# Patient Record
Sex: Female | Born: 1990 | Race: Black or African American | Hispanic: No | Marital: Married | Smoking: Never smoker
Health system: Southern US, Community
[De-identification: ages and names within clinical notes are randomized; demographics above are authoritative.]

## PROBLEM LIST (undated history)

## (undated) DIAGNOSIS — N730 Acute parametritis and pelvic cellulitis: Secondary | ICD-10-CM

## (undated) DIAGNOSIS — M419 Scoliosis, unspecified: Secondary | ICD-10-CM

## (undated) HISTORY — PX: BACK SURGERY: SHX140

---

## 2015-09-07 ENCOUNTER — Emergency Department (HOSPITAL_COMMUNITY): Payer: Self-pay

## 2015-09-07 ENCOUNTER — Emergency Department (HOSPITAL_COMMUNITY)
Admission: EM | Admit: 2015-09-07 | Discharge: 2015-09-07 | Disposition: A | Discharge status: Home / Self Care | Payer: Self-pay | Attending: Emergency Medicine | Admitting: Emergency Medicine

## 2015-09-07 ENCOUNTER — Encounter (HOSPITAL_COMMUNITY): Payer: Self-pay

## 2015-09-07 DIAGNOSIS — Z202 Contact with and (suspected) exposure to infections with a predominantly sexual mode of transmission: Secondary | ICD-10-CM | POA: Insufficient documentation

## 2015-09-07 DIAGNOSIS — N730 Acute parametritis and pelvic cellulitis: Secondary | ICD-10-CM | POA: Insufficient documentation

## 2015-09-07 DIAGNOSIS — Z8739 Personal history of other diseases of the musculoskeletal system and connective tissue: Secondary | ICD-10-CM | POA: Insufficient documentation

## 2015-09-07 DIAGNOSIS — Z3202 Encounter for pregnancy test, result negative: Secondary | ICD-10-CM | POA: Insufficient documentation

## 2015-09-07 DIAGNOSIS — Z711 Person with feared health complaint in whom no diagnosis is made: Secondary | ICD-10-CM

## 2015-09-07 DIAGNOSIS — R110 Nausea: Secondary | ICD-10-CM | POA: Insufficient documentation

## 2015-09-07 HISTORY — DX: Scoliosis, unspecified: M41.9

## 2015-09-07 LAB — URINALYSIS, ROUTINE W REFLEX MICROSCOPIC
BILIRUBIN URINE: NEGATIVE
GLUCOSE, UA: NEGATIVE mg/dL
KETONES UR: NEGATIVE mg/dL
Nitrite: NEGATIVE
PH: 6 (ref 5.0–8.0)
Protein, ur: NEGATIVE mg/dL
Specific Gravity, Urine: 1.01 (ref 1.005–1.030)

## 2015-09-07 LAB — WET PREP, GENITAL
Sperm: NONE SEEN
Trich, Wet Prep: NONE SEEN
YEAST WET PREP: NONE SEEN

## 2015-09-07 LAB — LIPASE, BLOOD: Lipase: 20 U/L (ref 11–51)

## 2015-09-07 LAB — I-STAT BETA HCG BLOOD, ED (MC, WL, AP ONLY): I-stat hCG, quantitative: <5 m[IU]/mL (ref <5)

## 2015-09-07 LAB — COMPREHENSIVE METABOLIC PANEL
ALBUMIN: 3.7 g/dL (ref 3.5–5.0)
ALT: 13 U/L — AB (ref 14–54)
AST: 20 U/L (ref 15–41)
Alkaline Phosphatase: 52 U/L (ref 38–126)
Anion gap: 11 (ref 5–15)
BILIRUBIN TOTAL: 1.3 mg/dL — AB (ref 0.3–1.2)
BUN: 8 mg/dL (ref 6–20)
CHLORIDE: 105 mmol/L (ref 101–111)
CO2: 23 mmol/L (ref 22–32)
Calcium: 9.2 mg/dL (ref 8.9–10.3)
Creatinine, Ser: 0.91 mg/dL (ref 0.44–1.00)
GFR calc Af Amer: >60 mL/min (ref >60)
GFR calc non Af Amer: >60 mL/min (ref >60)
GLUCOSE: 111 mg/dL — AB (ref 65–99)
POTASSIUM: 3.6 mmol/L (ref 3.5–5.1)
Sodium: 139 mmol/L (ref 135–145)
Total Protein: 7.4 g/dL (ref 6.5–8.1)

## 2015-09-07 LAB — CBC
HEMATOCRIT: 34.3 % — AB (ref 36.0–46.0)
Hemoglobin: 12 g/dL (ref 12.0–15.0)
MCH: 29.6 pg (ref 26.0–34.0)
MCHC: 35 g/dL (ref 30.0–36.0)
MCV: 84.5 fL (ref 78.0–100.0)
Platelets: 205 10*3/uL (ref 150–400)
RBC: 4.06 MIL/uL (ref 3.87–5.11)
RDW: 12.4 % (ref 11.5–15.5)
WBC: 8.6 10*3/uL (ref 4.0–10.5)

## 2015-09-07 LAB — URINE MICROSCOPIC-ADD ON

## 2015-09-07 MED ORDER — DEXTROSE 5 % IV SOLN
1.0000 g | Freq: Once | INTRAVENOUS | Status: AC
  Administered 2015-09-07: 1 g via INTRAVENOUS
  Filled 2015-09-07: qty 10

## 2015-09-07 MED ORDER — ONDANSETRON 4 MG PO TBDP: 4.0000 mg | ORAL_TABLET | Freq: Three times a day (TID) | ORAL | Status: DC | PRN

## 2015-09-07 MED ORDER — DOXYCYCLINE HYCLATE 100 MG PO TABS
100.0000 mg | ORAL_TABLET | Freq: Once | ORAL | Status: AC
  Administered 2015-09-07: 100 mg via ORAL
  Filled 2015-09-07: qty 1

## 2015-09-07 MED ORDER — NAPROXEN 250 MG PO TABS: 250.0000 mg | ORAL_TABLET | Freq: Two times a day (BID) | ORAL | Status: DC

## 2015-09-07 MED ORDER — ONDANSETRON 4 MG PO TBDP
4.0000 mg | ORAL_TABLET | Freq: Once | ORAL | Status: AC
  Administered 2015-09-07: 4 mg via ORAL
  Filled 2015-09-07: qty 1

## 2015-09-07 MED ORDER — SODIUM CHLORIDE 0.9 % IV BOLUS (SEPSIS)
1000.0000 mL | Freq: Once | INTRAVENOUS | Status: AC
  Administered 2015-09-07: 1000 mL via INTRAVENOUS

## 2015-09-07 MED ORDER — AZITHROMYCIN 250 MG PO TABS
1000.0000 mg | ORAL_TABLET | Freq: Once | ORAL | Status: AC
  Administered 2015-09-07: 1000 mg via ORAL
  Filled 2015-09-07: qty 4

## 2015-09-07 MED ORDER — IBUPROFEN 400 MG PO TABS
800.0000 mg | ORAL_TABLET | Freq: Once | ORAL | Status: AC
Start: 2015-09-07 — End: 2015-09-07
  Administered 2015-09-07: 800 mg via ORAL
  Filled 2015-09-07: qty 2

## 2015-09-07 MED ORDER — DOXYCYCLINE HYCLATE 100 MG PO CAPS: 100.0000 mg | ORAL_CAPSULE | Freq: Two times a day (BID) | ORAL | Status: DC

## 2015-09-07 NOTE — ED Notes (Signed)
Pt reports lower abdominal pain, onset last night associated with fatigue, nausea and chills that started today. She denies vomiting. She states she thinks she has  UTI due to dysuria and urinary frequency but has not been to doctor for that.

## 2015-09-07 NOTE — ED Notes (Signed)
PT REPORTS SHE TOOK 650mg  OF TYLENOL ALREADY TODAY AT 1230.

## 2015-09-07 NOTE — ED Provider Notes (Signed)
CSN: [id number]     Arrival date & time 09/07/15  1543 History  By signing my name below, I, Iona Beard, attest that this documentation has been prepared under the direction and in the presence of Everlene Farrier, PA-C.   Electronically Signed: Iona Beard, ED Scribe 09/07/2015 at 11:25 PM.   Chief Complaint  Patient presents with  . Abdominal Pain  . Fatigue    The history is provided by the patient. No language interpreter was used.   HPI Comments: Erica Hahn is a 25 y.o. female who presents to the Emergency Department complaining of gradual onset, burning, dysuria, ongoing for three days. Pt reports associated lower abdominal pain beginning last night, bilateral side pain worse on right side beginning last night, mild nausea, urinary frequency, fever, chills, and generalized body aches. No other associated symptoms noted. No worsening or alleviating factors noted. Pt has Mirena.  Pt is currently sexually active but does not use protection. Last normal bowel movement was yesterday. Pt denies vomiting, diarrhea, vaginal bleeding, vaginal discharge, cough, shortness of breath, urinary urgency, or any other pertinent symptoms.  Past Medical History  Diagnosis Date  . Scoliosis    Past Surgical History  Procedure Laterality Date  . Back surgery     No family history on file. Social History  Substance Use Topics  . Smoking status: Never Smoker   . Smokeless tobacco: None  . Alcohol Use: Yes   OB History    No data available     Review of Systems  Constitutional: Positive for fever and chills.  HENT: Negative for mouth sores.   Eyes: Negative for visual disturbance.  Respiratory: Negative for cough and shortness of breath.   Gastrointestinal: Positive for nausea and abdominal pain. Negative for vomiting, diarrhea and blood in stool.  Genitourinary: Positive for dysuria, frequency and flank pain. Negative for urgency, hematuria, vaginal bleeding, vaginal discharge,  difficulty urinating and genital sores.       Flank pain worse on right side.   Musculoskeletal: Positive for myalgias.  Skin: Negative for rash.  Neurological: Negative for light-headedness.     Allergies  Review of patient's allergies indicates not on file.  Home Medications   Prior to Admission medications   Medication Sig Start Date End Date Taking? Authorizing Provider  doxycycline (VIBRAMYCIN) 100 MG capsule Take 1 capsule (100 mg total) by mouth 2 (two) times daily. 09/07/15   Everlene Farrier, PA-C  naproxen (NAPROSYN) 250 MG tablet Take 1 tablet (250 mg total) by mouth 2 (two) times daily with a meal. 09/07/15   Everlene Farrier, PA-C  ondansetron (ZOFRAN ODT) 4 MG disintegrating tablet Take 1 tablet (4 mg total) by mouth every 8 (eight) hours as needed for nausea or vomiting. 09/07/15   Everlene Farrier, PA-C   BP 116/81 mmHg  Pulse 122  Temp(Src) 101.9 F (38.8 C) (Oral)  Resp 16  Ht 5\' 4"  (1.626 m)  Wt 138 lb 8 oz (62.823 kg)  BMI 23.76 kg/m2  SpO2 100%  LMP  (LMP Unknown) Physical Exam  Constitutional: She appears well-developed and well-nourished. No distress.  Nontoxic appearing.  HENT:  Head: Normocephalic and atraumatic.  Mouth/Throat: Oropharynx is clear and moist.  Eyes: Conjunctivae are normal. Pupils are equal, round, and reactive to light. Right eye exhibits no discharge. Left eye exhibits no discharge.  Neck: Neck supple.  Cardiovascular: Normal rate, regular rhythm, normal heart sounds and intact distal pulses.  Exam reveals no gallop and no friction rub.  No murmur heard. Pulmonary/Chest: Effort normal and breath sounds normal. No respiratory distress. She has no wheezes. She has no rales.  Abdominal: Soft. Bowel sounds are normal. She exhibits no distension. There is no tenderness. There is no rebound and no guarding.  Abdomen is soft and nontender to palpation. Bowel sounds are present. No peritoneal signs. Patient does have some bilateral flank tenderness to  palpation that is worse on her right side.  Genitourinary: Vaginal discharge found.  Pelvic exam performed by me with female RN chaperone. Patient has moderate amount of white malodorous vaginal discharge. No vaginal bleeding. Mild cervical motion tenderness. No adnexal tenderness or fullness.  Musculoskeletal: She exhibits no edema.  Lymphadenopathy:    She has no cervical adenopathy.  Neurological: She is alert. Coordination normal.  Skin: Skin is warm and dry. No rash noted. She is not diaphoretic. No erythema. No pallor.  Psychiatric: She has a normal mood and affect. Her behavior is normal.  Nursing note and vitals reviewed.   ED Course  Procedures (including critical care time) DIAGNOSTIC STUDIES: Oxygen Saturation is 100% on RA, normal by my interpretation.    COORDINATION OF CARE: 6:59 PM Discussed treatment plan with pt which includes zofran-ODT, ibuprofen, CXR, lipase, CMP, CBC, I-stat beta, pelvic exam, and urinalysis at bedside and pt agreed to plan.   Labs Review Labs Reviewed  WET PREP, GENITAL - Abnormal; Notable for the following:    Clue Cells Wet Prep HPF POC PRESENT (*)    WBC, Wet Prep HPF POC MANY (*)    All other components within normal limits  COMPREHENSIVE METABOLIC PANEL - Abnormal; Notable for the following:    Glucose, Bld 111 (*)    ALT 13 (*)    Total Bilirubin 1.3 (*)    All other components within normal limits  CBC - Abnormal; Notable for the following:    HCT 34.3 (*)    All other components within normal limits  URINALYSIS, ROUTINE W REFLEX MICROSCOPIC (NOT AT [facility]) - Abnormal; Notable for the following:    APPearance CLOUDY (*)    Hgb urine dipstick LARGE (*)    Leukocytes, UA LARGE (*)    All other components within normal limits  URINE MICROSCOPIC-ADD ON - Abnormal; Notable for the following:    Squamous Epithelial / LPF 0-5 (*)    Bacteria, UA RARE (*)    All other components within normal limits  LIPASE, BLOOD  I-STAT BETA HCG  BLOOD, ED ([facility], [facility], [facility] ONLY)  GC/CHLAMYDIA PROBE AMP ([place]) NOT AT [facility]    Imaging Review Dg Chest 2 View  09/07/2015  CLINICAL DATA:  Fever EXAM: CHEST  2 VIEW COMPARISON:  None. FINDINGS: Lungs are clear. Heart size and pulmonary vascular normal. No adenopathy. There is mid thoracic dextroscoliosis with upper lumbar levoscoliosis. There are fixation rods in place. IMPRESSION: Scoliosis.  No edema or consolidation. Electronically Signed   By: Bretta Bang III M.D.   On: 09/07/2015 16:42   I have personally reviewed and evaluated these lab results as part of my medical decision-making.   EKG Interpretation None      Filed Vitals:   09/07/15 1554 09/07/15 1955  BP: 116/81 118/61  Pulse: 122 79  Temp: 101.9 F (38.8 C) 99.5 F (37.5 C)  TempSrc: Oral   Resp: 16 18  Height:  (1.626 m)   Weight: 62.823 kg   SpO2: 100% 100%     MDM   Meds given in ED:  Medications  ibuprofen (ADVIL,MOTRIN) tablet 800 mg (800 mg Oral Given 09/07/15 1900)  ondansetron (ZOFRAN-ODT) disintegrating tablet 4 mg (4 mg Oral Given 09/07/15 1900)  cefTRIAXone (ROCEPHIN) 1 g in dextrose 5 % 50 mL IVPB (0 g Intravenous Stopped 09/07/15 2036)  sodium chloride 0.9 % bolus 1,000 mL (0 mLs Intravenous Stopped 09/07/15 2047)  azithromycin (ZITHROMAX) tablet 1,000 mg (1,000 mg Oral Given 09/07/15 2036)  doxycycline (VIBRA-TABS) tablet 100 mg (100 mg Oral Given 09/07/15 2040)    Discharge Medication List as of 09/07/2015  8:43 PM    START taking these medications   Details  doxycycline (VIBRAMYCIN) 100 MG capsule Take 1 capsule (100 mg total) by mouth 2 (two) times daily., Starting 09/07/2015, Until Discontinued, Print    naproxen (NAPROSYN) 250 MG tablet Take 1 tablet (250 mg total) by mouth 2 (two) times daily with a meal., Starting 09/07/2015, Until Discontinued, Print    ondansetron (ZOFRAN ODT) 4 MG disintegrating tablet Take 1 tablet (4 mg total) by mouth every 8 (eight) hours as needed for nausea or  vomiting., Starting 09/07/2015, Until Discontinued, Print        Final diagnoses:  PID (acute pelvic inflammatory disease)  Concern about STD in female without diagnosis   This  is a 25 y.o. female who presents to the Emergency Department complaining of gradual onset, burning, dysuria, ongoing for three days. Pt reports associated lower abdominal pain beginning last night, bilateral side pain worse on right side beginning last night, mild nausea, urinary frequency, fever, chills, and generalized body aches. No other associated symptoms noted. No worsening or alleviating factors noted. Pt has Mirena.  Pt is currently sexually active but does not use protection. Last normal bowel movement was yesterday. On arrival to the emergency department the patient has a temperature of 101.9. On my exam patient is nontoxic appearing. Her abdomen is soft and nontender to palpation. She does have some mild bilateral flank tenderness that is worse on her right side. Initially, urine results returned with large leukocytes, 6-30 white blood cells, nitrate negative and rare bacteria. I was concerned for possibly pyelonephritis or urinary tract infection and started the patient on a gram of Rocephin. And then I performed a pelvic exam and found the patient had a moderate amount of white vaginal malodorous discharge with cervical motion tenderness. Wet prep returned with many white blood cells. She has no adnexal tenderness or fullness. Her abdomen is nontender to palpation. On concern for PID. I feel that with this urine this points more to PID then a urinary tract infection or pyelonephritis. Patient has preserved kidney function. I sent the urine for culture.  Patient has been tolerating by mouth in the emergency department. I still provided her with Zofran when she received her anabiotic's. Patient received a gram of Rocephin, a gram of azithromycin and her first dose of doxycycline. We'll discharge with prescriptions for  naproxen, Zofran and doxycycline for PID. I advised that test are pending for gonorrhea and chlamydia and she should follow-up on these results. Discussed strict and specific return precautions related to her PID. I encouraged her to follow-up closely with OB/GYN. I advised that she did not see improvement within 24-48 hours she needs to return to the emergency department for reevaluation. I advised the patient to follow-up with their primary care provider this week. I advised the patient to return to the emergency department with new or worsening symptoms or new concerns. The patient verbalized understanding and agreement with plan.  This patient was discussed with Dr. Karma GanjaLinker who agrees with assessment and plan.   I personally performed the services described in this documentation, which was scribed in my presence. The recorded information has been reviewed and is accurate.         Everlene FarrierWilliam Navaya Wiatrek, PA-C 09/07/15 [id number]  Jerelyn ScottMartha Linker, MD 09/07/15 [phone]

## 2015-09-07 NOTE — Discharge Instructions (Signed)

## 2015-09-10 LAB — GC/CHLAMYDIA PROBE AMP (~~LOC~~) NOT AT ARMC
Chlamydia: NEGATIVE
Neisseria Gonorrhea: NEGATIVE

## 2015-09-12 ENCOUNTER — Ambulatory Visit (INDEPENDENT_AMBULATORY_CARE_PROVIDER_SITE_OTHER): Payer: Self-pay | Admitting: Obstetrics & Gynecology

## 2015-09-12 VITALS — BP 124/64 | HR 53 | Temp 98.9°F | Resp 17 | Ht 64.0 in | Wt 137.2 lb

## 2015-09-12 DIAGNOSIS — Z30011 Encounter for initial prescription of contraceptive pills: Secondary | ICD-10-CM

## 2015-09-12 DIAGNOSIS — N730 Acute parametritis and pelvic cellulitis: Secondary | ICD-10-CM | POA: Insufficient documentation

## 2015-09-12 DIAGNOSIS — Z30432 Encounter for removal of intrauterine contraceptive device: Secondary | ICD-10-CM

## 2015-09-12 MED ORDER — DOXYCYCLINE HYCLATE 100 MG PO CAPS: 100.0000 mg | ORAL_CAPSULE | Freq: Two times a day (BID) | ORAL | Status: DC

## 2015-09-12 MED ORDER — NORGESTIM-ETH ESTRAD TRIPHASIC 0.18/0.215/0.25 MG-25 MCG PO TABS: 1.0000 | ORAL_TABLET | Freq: Every day | ORAL | Status: DC

## 2015-09-12 NOTE — Addendum Note (Signed)
Addended by: Kathee DeltonHILLMAN, Lataria Courser L on: 09/12/2015 05:08 PM   Modules accepted: Orders

## 2015-09-12 NOTE — Progress Notes (Signed)
Patient ID: Erica Hahn, female   DOB: 09/07/1990, [age] y.o.   MRN: [id number] Pt is being seen in the clinic today for a ED f/u pt was diagnosed with PID and was checked for STD's the STD results were negative, pt reports that she is "fearful that the IUD is what has caused her PID" and would like to consult with Dr. Debroah LoopArnold today at her visit to see if she needs to get it out.

## 2015-09-12 NOTE — Progress Notes (Signed)
Patient ID: Erica HartsJohnneria M Hahn, female   DOB: 22-Mar-1991, 25 y.o.   MRN: [id number]  No chief complaint on file.   HPI Erica Hahn is a 25 y.o. female here for follow-up after ED visit where she was treated for PID. Patient is "feeling much better," as the crampy stomach pain has largely subsided. She denies recurrence of fevers, chills or muscle aches as she had upon ED presentation. She reports a persistence of burning with urination that preceded her PID presentation by 3 days. She reports a continued, though much lighter, vaginal discharge. Patient reports slight nausea and vomiting since initiating doxycycline. Patient is worried that IUD (placed in 2013) may be connected to PID; she says that she would like to get it removed, as it has caused dyspareunia for the past 1.5 yrs and given her very irregular periods.    HPI  Past Medical History  Diagnosis Date  . Scoliosis     Past Surgical History  Procedure Laterality Date  . Back surgery      No family history on file.  Social History Social History  Substance Use Topics  . Smoking status: Never Smoker   . Smokeless tobacco: Not on file  . Alcohol Use: Yes    Not on File  Current Outpatient Prescriptions  Medication Sig Dispense Refill  . doxycycline (VIBRAMYCIN) 100 MG capsule Take 1 capsule (100 mg total) by mouth 2 (two) times daily. 14 capsule 0  . naproxen (NAPROSYN) 250 MG tablet Take 1 tablet (250 mg total) by mouth 2 (two) times daily with a meal. 30 tablet 0  . ondansetron (ZOFRAN ODT) 4 MG disintegrating tablet Take 1 tablet (4 mg total) by mouth every 8 (eight) hours as needed for nausea or vomiting. (Patient not taking: Reported on 09/12/2015) 10 tablet 0   No current facility-administered medications for this visit.    Review of Systems Review of Systems  Constitutional: Negative for fever and chills.  Gastrointestinal: Positive for nausea and vomiting. Negative for constipation.  Genitourinary:  Positive for dysuria, vaginal discharge and dyspareunia. Negative for urgency, frequency, hematuria, flank pain, vaginal bleeding, enuresis, difficulty urinating, vaginal pain and pelvic pain.  Musculoskeletal: Negative for myalgias.      Blood pressure 124/64, pulse 53, temperature 98.9 F (37.2 C), temperature source Oral, resp. rate 17, height 5\' 4"  (1.626 m), weight 137 lb 3.2 oz (62.234 kg).  Physical Exam Physical Exam  Constitutional: She appears well-developed. No distress.  Pulmonary/Chest: Effort normal.  Genitourinary: Vaginal discharge (mucus d/c, string at os) found.  Consent given and IUD removed intact  Skin: Skin is warm and dry.  Psychiatric: She has a normal mood and affect. Her behavior is normal.    Assessment/Plan Ms Erica Hahn, a 25 yo with a history of PID diagnosed on 5/5, presents for follow-up evaluation and possible IUD removal. Patient reports resolution of majority of symptoms that brought her to the ED, but confirms persistence of dysuria and discharge. Patient is counseled on unlikely connection between IUD and PID, but encouraged to use barrier methods consistently in order to prevent STIs. Patient would like to begin OCPs and have IUD removed (believes it is causing dyspareunia and irregular periods).  IUD removed Ortho Tricyclen prescribed 14 days total ABX  Rizwan Kuyper 09/12/2015, 4:02 PM

## 2015-09-12 NOTE — Patient Instructions (Signed)
Oral Contraception Use Oral contraceptive pills (OCPs) are medicines taken to prevent pregnancy. OCPs work by preventing the ovaries from releasing eggs. The hormones in OCPs also cause the cervical mucus to thicken, preventing the sperm from entering the uterus. The hormones also cause the uterine lining to become thin, not allowing a fertilized egg to attach to the inside of the uterus. OCPs are highly effective when taken exactly as prescribed. However, OCPs do not prevent sexually transmitted diseases (STDs). Safe sex practices, such as using condoms along with an OCP, can help prevent STDs. Before taking OCPs, you may have a physical exam and Pap test. Your health care provider may also order blood tests if necessary. Your health care provider will make sure you are a good candidate for oral contraception. Discuss with your health care provider the possible side effects of the OCP you may be prescribed. When starting an OCP, it can take 2 to 3 months for the body to adjust to the changes in hormone levels in your body.  HOW TO TAKE ORAL CONTRACEPTIVE PILLS Your health care provider may advise you on how to start taking the first cycle of OCPs. Otherwise, you can:   Start on day 1 of your menstrual period. You will not need any backup contraceptive protection with this start time.   Start on the first Sunday after your menstrual period or the day you get your prescription. In these cases, you will need to use backup contraceptive protection for the first week.   Start the pill at any time of your cycle. If you take the pill within 5 days of the start of your period, you are protected against pregnancy right away. In this case, you will not need a backup form of birth control. If you start at any other time of your menstrual cycle, you will need to use another form of birth control for 7 days. If your OCP is the type called a minipill, it will protect you from pregnancy after taking it for 2 days (48  hours). After you have started taking OCPs:   If you forget to take 1 pill, take it as soon as you remember. Take the next pill at the regular time.   If you miss 2 or more pills, call your health care provider because different pills have different instructions for missed doses. Use backup birth control until your next menstrual period starts.   If you use a 28-day pack that contains inactive pills and you miss 1 of the last 7 pills (pills with no hormones), it will not matter. Throw away the rest of the non-hormone pills and start a new pill pack.  No matter which day you start the OCP, you will always start a new pack on that same day of the week. Have an extra pack of OCPs and a backup contraceptive method available in case you miss some pills or lose your OCP pack.  HOME CARE INSTRUCTIONS   Do not smoke.   Always use a condom to protect against STDs. OCPs do not protect against STDs.   Use a calendar to mark your menstrual period days.   Read the information and directions that came with your OCP. Talk to your health care provider if you have questions.  SEEK MEDICAL CARE IF:   You develop nausea and vomiting.   You have abnormal vaginal discharge or bleeding.   You develop a rash.   You miss your menstrual period.   You are losing   your hair.   You need treatment for mood swings or depression.   You get dizzy when taking the OCP.   You develop acne from taking the OCP.   You become pregnant.  SEEK IMMEDIATE MEDICAL CARE IF:   You develop chest pain.   You develop shortness of breath.   You have an uncontrolled or severe headache.   You develop numbness or slurred speech.   You develop visual problems.   You develop pain, redness, and swelling in the legs.    This information is not intended to replace advice given to you by your health care provider. Make sure you discuss any questions you have with your health care provider.   Document  Released: 04/10/2011 Document Revised: 05/12/2014 Document Reviewed: 10/10/2012 Elsevier Interactive Patient Education 2016 [employer]  

## 2016-01-11 ENCOUNTER — Encounter: Payer: Self-pay | Admitting: Certified Nurse Midwife

## 2016-01-11 ENCOUNTER — Ambulatory Visit (INDEPENDENT_AMBULATORY_CARE_PROVIDER_SITE_OTHER): Payer: Medicaid Other | Admitting: Certified Nurse Midwife

## 2016-01-11 VITALS — BP 116/76 | HR 95 | Temp 98.3°F | Wt 143.2 lb

## 2016-01-11 DIAGNOSIS — O219 Vomiting of pregnancy, unspecified: Secondary | ICD-10-CM

## 2016-01-11 DIAGNOSIS — Z3482 Encounter for supervision of other normal pregnancy, second trimester: Secondary | ICD-10-CM | POA: Diagnosis not present

## 2016-01-11 MED ORDER — ONDANSETRON HCL 4 MG PO TABS: 4.0000 mg | ORAL_TABLET | Freq: Every day | ORAL | 1 refills | Status: DC | PRN

## 2016-01-11 MED ORDER — VITAFOL GUMMIES 3.33-0.333-34.8 MG PO CHEW: 3.0000 | CHEWABLE_TABLET | Freq: Every day | ORAL | 12 refills | Status: DC

## 2016-01-11 MED ORDER — DOXYLAMINE-PYRIDOXINE 10-10 MG PO TBEC: DELAYED_RELEASE_TABLET | ORAL | 4 refills | Status: DC

## 2016-01-11 NOTE — Addendum Note (Signed)
Addended by: Lear NgMARTIN, MISTY L on: 01/11/2016 04:11 PM   Modules accepted: Orders

## 2016-01-11 NOTE — Progress Notes (Signed)
Subjective:    Erica Hahn is being seen today for her first obstetrical visit.  This is not a planned pregnancy. She is at 4184w2d gestation. Her obstetrical history is significant for hx of scoliosis, has rods along spine, will need spinal for anesthesia. Relationship with FOB: significant other, living together. Patient does intend to breast feed. Pregnancy history fully reviewed.  Not currently employed.    The information documented in the HPI was reviewed and verified.  Menstrual History: OB History    Gravida Para Term Preterm AB Living   2 1 1    0 1   SAB TAB Ectopic Multiple Live Births     0     1      Obstetric Comments   Has rods in spine and had to have spinal for delivery last time.        Menarche age: 25 years of age Patient's last menstrual period was 10/10/2015.    Past Medical History:  Diagnosis Date  . Scoliosis     Past Surgical History:  Procedure Laterality Date  . BACK SURGERY       (Not in a hospital admission) Not on File  Social History  Substance Use Topics  . Smoking status: Never Smoker  . Smokeless tobacco: Never Used  . Alcohol use 0.6 oz/week    1 Glasses of wine per week    Family History  Problem Relation Age of Onset  . Hypertension Mother   . Hypertension Father      Review of Systems Constitutional: negative for weight loss Gastrointestinal: negative for vomiting Genitourinary:negative for genital lesions and vaginal discharge and dysuria Musculoskeletal:negative for back pain Behavioral/Psych: negative for abusive relationship, depression, illegal drug usage and tobacco use    Objective:    BP 116/76   Pulse 95   Temp 98.3 F (36.8 C)   Wt 143 lb 3.2 oz (65 kg)   LMP 10/10/2015   BMI 24.58 kg/m  General Appearance:    Alert, cooperative, no distress, appears stated age  Head:    Normocephalic, without obvious abnormality, atraumatic  Eyes:    PERRL, conjunctiva/corneas clear, EOM's intact, fundi    benign,  both eyes  Ears:    Normal TM's and external ear canals, both ears  Nose:   Nares normal, septum midline, mucosa normal, no drainage    or sinus tenderness  Throat:   Lips, mucosa, and tongue normal; teeth and gums normal  Neck:   Supple, symmetrical, trachea midline, no adenopathy;    thyroid:  no enlargement/tenderness/nodules; no carotid   bruit or JVD  Back:     Symmetric, no curvature, ROM normal, no CVA tenderness  Lungs:     Clear to auscultation bilaterally, respirations unlabored  Chest Wall:    No tenderness or deformity   Heart:    Regular rate and rhythm, S1 and S2 normal, no murmur, rub   or gallop  Breast Exam:    No tenderness, masses, or nipple abnormality  Abdomen:     Soft, non-tender, bowel sounds active all four quadrants,    no masses, no organomegaly  Genitalia:    Normal female without lesion, discharge or tenderness  Extremities:   Extremities normal, atraumatic, no cyanosis or edema  Pulses:   2+ and symmetric all extremities  Skin:   Skin color, texture, turgor normal, no rashes or lesions  Lymph nodes:   Cervical, supraclavicular, and axillary nodes normal  Neurologic:   CNII-XII intact, normal strength,  sensation and reflexes    throughout      Cervix: long, thick, closed and posterior.  FH: less than U.  FHR: 160 by doppler.   Lab Review Urine pregnancy test Labs reviewed yes Radiologic studies reviewed no Assessment:    Pregnancy at [redacted]w[redacted]d weeks   N&V in early pregnancy  Plan:      Prenatal vitamins.  Counseling provided regarding continued use of seat belts, cessation of alcohol consumption, smoking or use of illicit drugs; infection precautions i.e., influenza/TDAP immunizations, toxoplasmosis,CMV, parvovirus, listeria and varicella; workplace safety, exercise during pregnancy; routine dental care, safe medications, sexual activity, hot tubs, saunas, pools, travel, caffeine use, fish and methlymercury, potential toxins, hair treatments, varicose  veins Weight gain recommendations per IOM guidelines reviewed: underweight/BMI< 18.5--> gain 28 - 40 lbs; normal weight/BMI 18.5 - 24.9--> gain 25 - 35 lbs; overweight/BMI 25 - 29.9--> gain 15 - 25 lbs; obese/BMI >30->gain  11 - 20 lbs Problem list reviewed and updated. FIRST/CF mutation testing/NIPT/QUAD SCREEN/fragile X/[name] population testing/Spinal muscular atrophy discussed: ordered. Role of ultrasound in pregnancy discussed; fetal survey: requested. Amniocentesis discussed: not indicated. VBAC calculator score: VBAC consent form provided Meds ordered this encounter  Medications  . Doxylamine-Pyridoxine (DICLEGIS) 10-10 MG TBEC    Sig: Take 1 tablet with breakfast and lunch.  Take 2 tablets at bedtime.    Dispense:  100 tablet    Refill:  4  . ondansetron (ZOFRAN) 4 MG tablet    Sig: Take 1 tablet (4 mg total) by mouth daily as needed.    Dispense:  30 tablet    Refill:  1  . Prenatal Vit-Fe Phos-FA-Omega (VITAFOL GUMMIES) 3.33-0.333-34.8 MG CHEW    Sig: Chew 3 tablets by mouth daily.    Dispense:  90 tablet    Refill:  12   Orders Placed This Encounter  Procedures  . Culture, OB Urine  . US OB Limited    Standing Status:   Future    Standing Expiration Date:   03/12/2017    Order Specific Question:   Reason for Exam (SYMPTOM  OR DIAGNOSIS REQUIRED)    Answer:   dating    Order Specific Question:   Preferred imaging location?    Answer:   Internal  . TSH  . HIV antibody  . Hemoglobinopathy evaluation  . Varicella zoster antibody, IgG  . MaterniT21 PLUS Core+SCA    Order Specific Question:   Is the patient insulin dependent?    Answer:   No    Order Specific Question:   Please enter gestational age. This should be expressed as weeks AND days, i.e. 16w 6d. Enter weeks here. Enter days in next question.    Answer:   36    Order Specific Question:   Please enter gestational age. This should be expressed as weeks AND days, i.e. 16w 6d. Enter days here. Enter weeks  in previous question.    Answer:   2    Order Specific Question:   How was gestational age calculated?    Answer:   LMP    Order Specific Question:   Please give the date of LMP OR Ultrasound OR Estimated date of delivery.    Answer:   07/16/2016    Order Specific Question:   Number of Fetuses (Type of Pregnancy):    Answer:   1    Order Specific Question:   Indications for performing the test? (please choose all that apply):    Answer:   Routine  screening    Order Specific Question:   Other Indications? (Y=Yes, N=No)    Answer:   N    Order Specific Question:   If this is a repeat specimen, please indicate the reason:    Answer:   Not indicated    Order Specific Question:   Please specify the patient's race: (C=White/Caucasion, B=Black, I=Native American, A=Asian, H=Hispanic, O=Other, U=Unknown)    Answer:   B    Order Specific Question:   Donor Egg - indicate if the egg was obtained from in vitro fertilization.    Answer:   N    Order Specific Question:   Age of Egg Donor.    Answer:   10    Order Specific Question:   Prior Down Syndrome/ONTD screening during current pregnancy.    Answer:   N    Order Specific Question:   Prior First Trimester Testing    Answer:   N    Order Specific Question:   Prior Second Trimester Testing    Answer:   N    Order Specific Question:   Family History of Neural Tube Defects    Answer:   N    Order Specific Question:   Prior Pregnancy with Down Syndrome    Answer:   N    Order Specific Question:   Please give the patient's weight (in pounds)    Answer:   143  . Prenatal Profile I    Follow up in 4 weeks. 50% of 30 min visit spent on counseling and coordination of care.

## 2016-01-13 LAB — URINE CULTURE, OB REFLEX: ORGANISM ID, BACTERIA: NO GROWTH

## 2016-01-13 LAB — CULTURE, OB URINE

## 2016-01-15 ENCOUNTER — Other Ambulatory Visit: Payer: Self-pay | Admitting: Certified Nurse Midwife

## 2016-01-16 ENCOUNTER — Ambulatory Visit (HOSPITAL_COMMUNITY): Payer: Self-pay

## 2016-01-16 ENCOUNTER — Other Ambulatory Visit: Payer: Self-pay | Admitting: Certified Nurse Midwife

## 2016-01-16 DIAGNOSIS — Z3482 Encounter for supervision of other normal pregnancy, second trimester: Secondary | ICD-10-CM

## 2016-01-16 LAB — PAP IG W/ RFLX HPV ASCU: PAP Smear Comment: 0

## 2016-01-16 NOTE — Addendum Note (Signed)
Addended by: Pennie BanterSMITH, Kayler Rise W on: 01/16/2016 04:43 PM   Modules accepted: Orders

## 2016-01-17 ENCOUNTER — Telehealth: Payer: Self-pay | Admitting: *Deleted

## 2016-01-17 ENCOUNTER — Other Ambulatory Visit: Payer: Self-pay | Admitting: Certified Nurse Midwife

## 2016-01-17 DIAGNOSIS — B9689 Other specified bacterial agents as the cause of diseases classified elsewhere: Secondary | ICD-10-CM

## 2016-01-17 DIAGNOSIS — N760 Acute vaginitis: Principal | ICD-10-CM

## 2016-01-17 LAB — NUSWAB VG+, CANDIDA 6SP
ATOPOBIUM VAGINAE: HIGH {score} — AB
BVAB 2: HIGH Score — AB
CANDIDA GLABRATA, NAA: NEGATIVE
CANDIDA KRUSEI, NAA: NEGATIVE
CANDIDA LUSITANIAE, NAA: NEGATIVE
CANDIDA TROPICALIS, NAA: NEGATIVE
Candida albicans, NAA: NEGATIVE
Candida parapsilosis, NAA: NEGATIVE
Chlamydia trachomatis, NAA: NEGATIVE
Megasphaera 1: HIGH Score — AB
Neisseria gonorrhoeae, NAA: NEGATIVE
Trich vag by NAA: NEGATIVE

## 2016-01-17 MED ORDER — METRONIDAZOLE 0.75 % VA GEL: 1.0000 | Freq: Two times a day (BID) | VAGINAL | 0 refills | Status: DC

## 2016-01-17 NOTE — Telephone Encounter (Deleted)
Phone message to call office for lab results

## 2016-01-18 ENCOUNTER — Telehealth: Payer: Self-pay | Admitting: *Deleted

## 2016-01-18 LAB — TOXASSURE SELECT 13 (MW), URINE

## 2016-01-18 NOTE — Telephone Encounter (Signed)
PA received from pharmacy for pt Diclegis Rx. PA was submitted and approved with [employer]  Call placed to pharmacy making them aware of PA approval.

## 2016-01-19 LAB — HEMOGLOBINOPATHY EVALUATION
HEMOGLOBIN F QUANTITATION: 0 % (ref 0.0–2.0)
HGB C: 0 %
HGB S: 40.2 % — AB
Hemoglobin A2 Quantitation: 4.2 % — ABNORMAL HIGH (ref 0.7–3.1)
Hgb A: 55.6 % — ABNORMAL LOW (ref 94.0–98.0)

## 2016-01-19 LAB — PRENATAL PROFILE I(LABCORP)
Antibody Screen: NEGATIVE
BASOS ABS: 0 10*3/uL (ref 0.0–0.2)
Basos: 0 %
EOS (ABSOLUTE): 0.2 10*3/uL (ref 0.0–0.4)
EOS: 1 %
HEMOGLOBIN: 12.3 g/dL (ref 11.1–15.9)
Hematocrit: 36.1 % (ref 34.0–46.6)
Hepatitis B Surface Ag: NEGATIVE
IMMATURE GRANULOCYTES: 1 %
Immature Grans (Abs): 0.1 10*3/uL (ref 0.0–0.1)
LYMPHS ABS: 1.6 10*3/uL (ref 0.7–3.1)
Lymphs: 13 %
MCH: 29.7 pg (ref 26.6–33.0)
MCHC: 34.1 g/dL (ref 31.5–35.7)
MCV: 87 fL (ref 79–97)
MONOS ABS: 1 10*3/uL — AB (ref 0.1–0.9)
Monocytes: 8 %
NEUTROS ABS: 9.7 10*3/uL — AB (ref 1.4–7.0)
NEUTROS PCT: 77 %
PLATELETS: 196 10*3/uL (ref 150–379)
RBC: 4.14 x10E6/uL (ref 3.77–5.28)
RDW: 12.9 % (ref 12.3–15.4)
RH TYPE: POSITIVE
RPR Ser Ql: NONREACTIVE
Rubella Antibodies, IGG: 3.14 index (ref >0.99)
WBC: 12.6 10*3/uL — AB (ref 3.4–10.8)

## 2016-01-19 LAB — MATERNIT21 PLUS CORE+SCA
CHROMOSOME 13: NEGATIVE
Chromosome 18: NEGATIVE
Chromosome 21: NEGATIVE
PDF: 0
Y CHROMOSOME: DETECTED

## 2016-01-19 LAB — HIV ANTIBODY (ROUTINE TESTING W REFLEX): HIV Screen 4th Generation wRfx: NONREACTIVE

## 2016-01-19 LAB — VARICELLA ZOSTER ANTIBODY, IGG: VARICELLA: 1096 {index} (ref >165)

## 2016-01-19 LAB — TSH: TSH: 0.818 u[IU]/mL (ref 0.450–4.500)

## 2016-01-21 ENCOUNTER — Ambulatory Visit (HOSPITAL_COMMUNITY): Admission: RE | Admit: 2016-01-21 | Payer: Medicaid Other | Source: Ambulatory Visit

## 2016-01-21 ENCOUNTER — Ambulatory Visit (HOSPITAL_COMMUNITY)
Admission: RE | Admit: 2016-01-21 | Discharge: 2016-01-21 | Disposition: A | Discharge status: Home / Self Care | Payer: Medicaid Other | Source: Ambulatory Visit | Attending: Certified Nurse Midwife | Admitting: Certified Nurse Midwife

## 2016-01-21 ENCOUNTER — Other Ambulatory Visit: Payer: Self-pay | Admitting: Certified Nurse Midwife

## 2016-01-21 DIAGNOSIS — Z36 Encounter for antenatal screening of mother: Secondary | ICD-10-CM | POA: Insufficient documentation

## 2016-01-21 DIAGNOSIS — Z3A14 14 weeks gestation of pregnancy: Secondary | ICD-10-CM

## 2016-01-21 DIAGNOSIS — Z3482 Encounter for supervision of other normal pregnancy, second trimester: Secondary | ICD-10-CM

## 2016-01-23 ENCOUNTER — Telehealth: Payer: Self-pay | Admitting: *Deleted

## 2016-01-23 NOTE — Telephone Encounter (Signed)
Message left to call office for lab results

## 2016-01-23 NOTE — Telephone Encounter (Signed)
Left message to call office for lab results.

## 2016-01-23 NOTE — Telephone Encounter (Signed)
Patient returned call and was made aware of of lab results and medication called to her pharmacy.

## 2016-01-27 ENCOUNTER — Other Ambulatory Visit: Payer: Self-pay | Admitting: Certified Nurse Midwife

## 2016-01-27 DIAGNOSIS — Z3482 Encounter for supervision of other normal pregnancy, second trimester: Secondary | ICD-10-CM

## 2016-01-27 DIAGNOSIS — Z3480 Encounter for supervision of other normal pregnancy, unspecified trimester: Secondary | ICD-10-CM | POA: Insufficient documentation

## 2016-02-07 ENCOUNTER — Encounter: Payer: Self-pay | Admitting: Certified Nurse Midwife

## 2016-02-13 ENCOUNTER — Ambulatory Visit (INDEPENDENT_AMBULATORY_CARE_PROVIDER_SITE_OTHER): Payer: Medicaid Other | Admitting: Certified Nurse Midwife

## 2016-02-13 DIAGNOSIS — Z3480 Encounter for supervision of other normal pregnancy, unspecified trimester: Secondary | ICD-10-CM

## 2016-02-13 DIAGNOSIS — Z3482 Encounter for supervision of other normal pregnancy, second trimester: Secondary | ICD-10-CM

## 2016-02-13 NOTE — Progress Notes (Signed)
Subjective:    Erica Hahn is a 25 y.o. female being seen today for her obstetrical visit. She is at 7141w0d gestation. Patient reports: no bleeding, no contractions, no cramping, no leaking and constipation . Fetal movement: normal.  Discussed OTC laxatives: colace, miralax encouraged along with increased fluid and fiber intake.  Patient verbalized understanding.    Problem List Items Addressed This Visit    None    Visit Diagnoses   None.    Patient Active Problem List   Diagnosis Date Noted  . Supervision of other normal pregnancy, antepartum 01/27/2016  . PID (acute pelvic inflammatory disease) 09/12/2015   Objective:    BP 120/75   Pulse 98   Temp 98.6 F (37 C)   Wt 147 lb 4.8 oz (66.8 kg)   LMP 10/10/2015   BMI 25.28 kg/m  FHT: 147 BPM  Uterine Size: 18 cm and size equals dates     Assessment:    Pregnancy @ 5741w0d    Constipation  Plan:    OBGCT: discussed. Signs and symptoms of preterm labor: discussed.  Labs, problem list reviewed and updated 2 hr GTT planned Follow up in 4 weeks.

## 2016-02-13 NOTE — Progress Notes (Signed)
Patient c/o constipation 

## 2016-02-21 ENCOUNTER — Ambulatory Visit (HOSPITAL_COMMUNITY): Payer: Medicaid Other | Attending: Certified Nurse Midwife

## 2016-02-25 ENCOUNTER — Ambulatory Visit (HOSPITAL_COMMUNITY): Payer: Medicaid Other

## 2016-03-11 ENCOUNTER — Ambulatory Visit (HOSPITAL_COMMUNITY)
Admission: RE | Admit: 2016-03-11 | Discharge: 2016-03-11 | Disposition: A | Discharge status: Home / Self Care | Payer: Medicaid Other | Source: Ambulatory Visit | Attending: Certified Nurse Midwife | Admitting: Certified Nurse Midwife

## 2016-03-11 ENCOUNTER — Other Ambulatory Visit: Payer: Self-pay | Admitting: Certified Nurse Midwife

## 2016-03-11 DIAGNOSIS — Z3A21 21 weeks gestation of pregnancy: Secondary | ICD-10-CM

## 2016-03-11 DIAGNOSIS — Z0489 Encounter for examination and observation for other specified reasons: Secondary | ICD-10-CM

## 2016-03-11 DIAGNOSIS — Z362 Encounter for other antenatal screening follow-up: Secondary | ICD-10-CM | POA: Diagnosis present

## 2016-03-11 DIAGNOSIS — Z862 Personal history of diseases of the blood and blood-forming organs and certain disorders involving the immune mechanism: Secondary | ICD-10-CM | POA: Diagnosis not present

## 2016-03-11 DIAGNOSIS — IMO0002 Reserved for concepts with insufficient information to code with codable children: Secondary | ICD-10-CM

## 2016-03-11 DIAGNOSIS — Z3482 Encounter for supervision of other normal pregnancy, second trimester: Secondary | ICD-10-CM

## 2016-03-12 ENCOUNTER — Ambulatory Visit (INDEPENDENT_AMBULATORY_CARE_PROVIDER_SITE_OTHER): Payer: Medicaid Other | Admitting: Certified Nurse Midwife

## 2016-03-12 DIAGNOSIS — Z3482 Encounter for supervision of other normal pregnancy, second trimester: Secondary | ICD-10-CM

## 2016-03-12 DIAGNOSIS — Z3480 Encounter for supervision of other normal pregnancy, unspecified trimester: Secondary | ICD-10-CM

## 2016-03-12 NOTE — Patient Instructions (Addendum)
Glucose Tolerance Test During Pregnancy The glucose tolerance test (GTT) is a blood test used to determine if you have developed a type of diabetes during pregnancy (gestational diabetes). This is when your body does not properly process sugar (glucose) in the food you eat, resulting in high blood glucose levels. Typically, a GTT is done after you have had a 1-hour glucose test with results that indicate you possibly have gestational diabetes. It may also be done if:  You have a history of giving birth to very large babies or have experienced repeated fetal loss (stillbirth).   You have signs and symptoms of diabetes, such as:   Changes in your vision.   Tingling or numbness in your hands or feet.   Changes in hunger, thirst, and urination not otherwise explained by your pregnancy.  The GTT lasts about 3 hours. You will be given a sugar-water solution to drink at the beginning of the test. You will have blood drawn before you drink the solution and then again 1, 2, and 3 hours after you drink it. You will not be allowed to eat or drink anything else during the test. You must remain at the testing location to make sure that your blood is drawn on time. You should also avoid exercising during the test, because exercise can alter test results. PREPARATION FOR TEST  Eat normally for 3 days prior to the GTT test, including having plenty of carbohydrate-rich foods. Do not eat or drink anything except water during the final 12 hours before the test. In addition, your health care provider may ask you to stop taking certain medicines before the test. RESULTS  It is your responsibility to obtain your test results. Ask the lab or department performing the test when and how you will get your results. Contact your health care provider to discuss any questions you have about your results.  Range of Normal Values Ranges for normal values may vary among different labs and hospitals. You should always check  with your health care provider after having lab work or other tests done to discuss whether your values are considered within normal limits. Normal levels of blood glucose are as follows:  Fasting: less than 105 mg/dL.   1 hour after drinking the solution: less than 190 mg/dL.   2 hours after drinking the solution: less than 165 mg/dL.   3 hours after drinking the solution: less than 145 mg/dL.  Some substances can interfere with GTT results. These may include:  Blood pressure and heart failure medicines, including beta blockers, furosemide, and thiazides.   Anti-inflammatory medicines, including aspirin.   Nicotine.   Some psychiatric medicines.  Meaning of Results Outside Normal Value Ranges GTT test results that are above normal values may indicate a number of health problems, such as:   Gestational diabetes.   Acute stress response.   Cushing syndrome.   Tumors such as pheochromocytoma or glucagonoma.   Long-term kidney problems.   Pancreatitis.   Hyperthyroidism.   Current infection.  Discuss your test results with your health care provider. He or she will use the results to make a diagnosis and determine a treatment plan that is right for you.   This information is not intended to replace advice given to you by your health care provider. Make sure you discuss any questions you have with your health care provider.   Document Released: 10/21/2011 Document Revised: 05/12/2014 Document Reviewed: 08/26/2013 Elsevier Interactive Patient Education 2016 [employer] Second Trimester of Pregnancy The  second trimester is from week 13 through week 28, months 4 through 6. The second trimester is often a time when you feel your best. Your body has also adjusted to being pregnant, and you begin to feel better physically. Usually, morning sickness has lessened or quit completely, you may have more energy, and you may have an increase in appetite. The second  trimester is also a time when the fetus is growing rapidly. At the end of the sixth month, the fetus is about 9 inches long and weighs about 1 pounds. You will likely begin to feel the baby move (quickening) between 18 and 20 weeks of the pregnancy. BODY CHANGES Your body goes through many changes during pregnancy. The changes vary from woman to woman.   Your weight will continue to increase. You will notice your lower abdomen bulging out.  You may begin to get stretch marks on your hips, abdomen, and breasts.  You may develop headaches that can be relieved by medicines approved by your health care provider.  You may urinate more often because the fetus is pressing on your bladder.  You may develop or continue to have heartburn as a result of your pregnancy.  You may develop constipation because certain hormones are causing the muscles that push waste through your intestines to slow down.  You may develop hemorrhoids or swollen, bulging veins (varicose veins).  You may have back pain because of the weight gain and pregnancy hormones relaxing your joints between the bones in your pelvis and as a result of a shift in weight and the muscles that support your balance.  Your breasts will continue to grow and be tender.  Your gums may bleed and may be sensitive to brushing and flossing.  Dark spots or blotches (chloasma, mask of pregnancy) may develop on your face. This will likely fade after the baby is born.  A dark line from your belly button to the pubic area (linea nigra) may appear. This will likely fade after the baby is born.  You may have changes in your hair. These can include thickening of your hair, rapid growth, and changes in texture. Some women also have hair loss during or after pregnancy, or hair that feels dry or thin. Your hair will most likely return to normal after your baby is born. WHAT TO EXPECT AT YOUR PRENATAL VISITS During a routine prenatal visit:  You will be  weighed to make sure you and the fetus are growing normally.  Your blood pressure will be taken.  Your abdomen will be measured to track your baby's growth.  The fetal heartbeat will be listened to.  Any test results from the previous visit will be discussed. Your health care provider may ask you:  How you are feeling.  If you are feeling the baby move.  If you have had any abnormal symptoms, such as leaking fluid, bleeding, severe headaches, or abdominal cramping.  If you are using any tobacco products, including cigarettes, chewing tobacco, and electronic cigarettes.  If you have any questions. Other tests that may be performed during your second trimester include:  Blood tests that check for:  Low iron levels (anemia).  Gestational diabetes (between 24 and 28 weeks).  Rh antibodies.  Urine tests to check for infections, diabetes, or protein in the urine.  An ultrasound to confirm the proper growth and development of the baby.  An amniocentesis to check for possible genetic problems.  Fetal screens for spina bifida and Down syndrome.  HIV (human immunodeficiency virus) testing. Routine prenatal testing includes screening for HIV, unless you choose not to have this test. HOME CARE INSTRUCTIONS   Avoid all smoking, herbs, alcohol, and unprescribed drugs. These chemicals affect the formation and growth of the baby.  Do not use any tobacco products, including cigarettes, chewing tobacco, and electronic cigarettes. If you need help quitting, ask your health care provider. You may receive counseling support and other resources to help you quit.  Follow your health care provider's instructions regarding medicine use. There are medicines that are either safe or unsafe to take during pregnancy.  Exercise only as directed by your health care provider. Experiencing uterine cramps is a good sign to stop exercising.  Continue to eat regular, healthy meals.  Wear a good support  bra for breast tenderness.  Do not use hot tubs, steam rooms, or saunas.  Wear your seat belt at all times when driving.  Avoid raw meat, uncooked cheese, cat litter boxes, and soil used by cats. These carry germs that can cause birth defects in the baby.  Take your prenatal vitamins.  Take 1500-2000 mg of calcium daily starting at the 20th week of pregnancy until you deliver your baby.  Try taking a stool softener (if your health care provider approves) if you develop constipation. Eat more high-fiber foods, such as fresh vegetables or fruit and whole grains. Drink plenty of fluids to keep your urine clear or pale yellow.  Take warm sitz baths to soothe any pain or discomfort caused by hemorrhoids. Use hemorrhoid cream if your health care provider approves.  If you develop varicose veins, wear support hose. Elevate your feet for 15 minutes, 3-4 times a day. Limit salt in your diet.  Avoid heavy lifting, wear low heel shoes, and practice good posture.  Rest with your legs elevated if you have leg cramps or low back pain.  Visit your dentist if you have not gone yet during your pregnancy. Use a soft toothbrush to brush your teeth and be gentle when you floss.  A sexual relationship may be continued unless your health care provider directs you otherwise.  Continue to go to all your prenatal visits as directed by your health care provider. SEEK MEDICAL CARE IF:   You have dizziness.  You have mild pelvic cramps, pelvic pressure, or nagging pain in the abdominal area.  You have persistent nausea, vomiting, or diarrhea.  You have a bad smelling vaginal discharge.  You have pain with urination. SEEK IMMEDIATE MEDICAL CARE IF:   You have a fever.  You are leaking fluid from your vagina.  You have spotting or bleeding from your vagina.  You have severe abdominal cramping or pain.  You have rapid weight gain or loss.  You have shortness of breath with chest pain.  You  notice sudden or extreme swelling of your face, hands, ankles, feet, or legs.  You have not felt your baby move in over an hour.  You have severe headaches that do not go away with medicine.  You have vision changes.   This information is not intended to replace advice given to you by your health care provider. Make sure you discuss any questions you have with your health care provider.   Document Released: 04/15/2001 Document Revised: 05/12/2014 Document Reviewed: 06/22/2012 Elsevier Interactive Patient Education 2016 Elsevier Inc. Round Ligament Pain The round ligament is a cord of muscle and tissue that helps to support the uterus. It can become a source of pain  during pregnancy if it becomes stretched or twisted as the baby grows. The pain usually begins in the second trimester of pregnancy, and it can come and go until the baby is delivered. It is not a serious problem, and it does not cause harm to the baby. Round ligament pain is usually a short, sharp, and pinching pain, but it can also be a dull, lingering, and aching pain. The pain is felt in the lower side of the abdomen or in the groin. It usually starts deep in the groin and moves up to the outside of the hip area. Pain can occur with:  A sudden change in position.  Rolling over in bed.  Coughing or sneezing.  Physical activity. HOME CARE INSTRUCTIONS Watch your condition for any changes. Take these steps to help with your pain:  When the pain starts, relax. Then try:  Sitting down.  Flexing your knees up to your abdomen.  Lying on your side with one pillow under your abdomen and another pillow between your legs.  Sitting in a warm bath for 15-20 minutes or until the pain goes away.  Take over-the-counter and prescription medicines only as told by your health care provider.  Move slowly when you sit and stand.  Avoid long walks if they cause pain.  Stop or lessen your physical activities if they cause  pain. SEEK MEDICAL CARE IF:  Your pain does not go away with treatment.  You feel pain in your back that you did not have before.  Your medicine is not helping. SEEK IMMEDIATE MEDICAL CARE IF:  You develop a fever or chills.  You develop uterine contractions.  You develop vaginal bleeding.  You develop nausea or vomiting.  You develop diarrhea.  You have pain when you urinate.   This information is not intended to replace advice given to you by your health care provider. Make sure you discuss any questions you have with your health care provider.   Document Released: 01/29/2008 Document Revised: 07/14/2011 Document Reviewed: 06/28/2014 Elsevier Interactive Patient Education [employer] Else[employer] Inc[employer]

## 2016-03-12 NOTE — Progress Notes (Signed)
Subjective:    Erica Hahn is a 25 y.o. female being seen today for her obstetrical visit. She is at 3410w0d gestation. Patient reports: no bleeding, no contractions, no cramping, no leaking and round ligament type pain . Fetal movement: normal.  Problem List Items Addressed This Visit      Other   Supervision of other normal pregnancy, antepartum     Patient Active Problem List   Diagnosis Date Noted  . Supervision of other normal pregnancy, antepartum 01/27/2016  . PID (acute pelvic inflammatory disease) 09/12/2015   Objective:    LMP 10/10/2015  FHT: 151 BPM  Uterine Size: 22 cm and size equals dates     Assessment:    Pregnancy @ 5510w0d    Round ligament pain  Plan:    OBGCT: discussed and ordered for next visit. Signs and symptoms of preterm labor: discussed and handout given.  Labs, problem list reviewed and updated 2 hr GTT planned Follow up in 4 weeks with 2 hour OGTT.

## 2016-03-13 ENCOUNTER — Other Ambulatory Visit: Payer: Self-pay | Admitting: Certified Nurse Midwife

## 2016-03-13 DIAGNOSIS — Z3480 Encounter for supervision of other normal pregnancy, unspecified trimester: Secondary | ICD-10-CM

## 2016-03-25 ENCOUNTER — Other Ambulatory Visit: Payer: Self-pay | Admitting: Certified Nurse Midwife

## 2016-04-10 ENCOUNTER — Encounter: Payer: Medicaid Other | Admitting: Certified Nurse Midwife

## 2016-04-10 ENCOUNTER — Other Ambulatory Visit: Payer: Medicaid Other

## 2016-04-18 ENCOUNTER — Other Ambulatory Visit: Payer: Medicaid Other

## 2016-04-18 DIAGNOSIS — Z3490 Encounter for supervision of normal pregnancy, unspecified, unspecified trimester: Secondary | ICD-10-CM

## 2016-04-19 LAB — CBC
HEMOGLOBIN: 12.4 g/dL (ref 11.1–15.9)
Hematocrit: 36.1 % (ref 34.0–46.6)
MCH: 29.5 pg (ref 26.6–33.0)
MCHC: 34.3 g/dL (ref 31.5–35.7)
MCV: 86 fL (ref 79–97)
Platelets: 180 10*3/uL (ref 150–379)
RBC: 4.21 x10E6/uL (ref 3.77–5.28)
RDW: 13.7 % (ref 12.3–15.4)
WBC: 11.2 10*3/uL — ABNORMAL HIGH (ref 3.4–10.8)

## 2016-04-19 LAB — GLUCOSE TOLERANCE, 2 HOURS W/ 1HR
GLUCOSE, 1 HOUR: 124 mg/dL (ref 65–179)
GLUCOSE, 2 HOUR: 102 mg/dL (ref 65–152)
Glucose, Fasting: 81 mg/dL (ref 65–91)

## 2016-04-19 LAB — HIV ANTIBODY (ROUTINE TESTING W REFLEX): HIV SCREEN 4TH GENERATION: NONREACTIVE

## 2016-04-19 LAB — RPR: RPR: NONREACTIVE

## 2016-04-21 ENCOUNTER — Other Ambulatory Visit: Payer: Self-pay | Admitting: Certified Nurse Midwife

## 2016-04-21 DIAGNOSIS — Z3480 Encounter for supervision of other normal pregnancy, unspecified trimester: Secondary | ICD-10-CM

## 2016-04-24 ENCOUNTER — Ambulatory Visit (INDEPENDENT_AMBULATORY_CARE_PROVIDER_SITE_OTHER): Payer: Medicaid Other | Admitting: Family Medicine

## 2016-04-24 VITALS — BP 110/65 | HR 81 | Wt 159.8 lb

## 2016-04-24 DIAGNOSIS — Z3483 Encounter for supervision of other normal pregnancy, third trimester: Secondary | ICD-10-CM

## 2016-04-24 DIAGNOSIS — Z3480 Encounter for supervision of other normal pregnancy, unspecified trimester: Secondary | ICD-10-CM

## 2016-04-24 MED ORDER — PREPLUS 27-1 MG PO TABS: 1.0000 | ORAL_TABLET | Freq: Every day | ORAL | 13 refills | Status: DC

## 2016-04-24 NOTE — Progress Notes (Signed)
Patient states she has possible contractions- 3/4 per day. Patient states they rate a 5 on 1-10 scale. Patient notices them more in the evening when she is still.

## 2016-04-24 NOTE — Progress Notes (Signed)
   PRENATAL VISIT NOTE  Subjective:  Erica HartsJohnneria M Hahn is a 25 y.o. G2P1001 at 1480w1d being seen today for ongoing prenatal care.  She is currently monitored for the following issues for this low-risk pregnancy and has PID (acute pelvic inflammatory disease) and Supervision of other normal pregnancy, antepartum on her problem list.  Patient reports occasional contractions - 4-5 a day.  Contractions: Irregular. Vag. Bleeding: None.  Movement: Present. Denies leaking of fluid.   The following portions of the patient's history were reviewed and updated as appropriate: allergies, current medications, past family history, past medical history, past social history, past surgical history and problem list. Problem list updated.  Objective:   Vitals:   04/24/16 1602  BP: 110/65  Pulse: 81  Weight: 159 lb 12.8 oz (72.5 kg)    Fetal Status: Fetal Heart Rate (bpm): 139 Fundal Height: 28 cm Movement: Present  Presentation: Vertex  General:  Alert, oriented and cooperative. Patient is in no acute distress.  Skin: Skin is warm and dry. No rash noted.   Cardiovascular: Normal heart rate noted  Respiratory: Normal respiratory effort, no problems with respiration noted  Abdomen: Soft, gravid, appropriate for gestational age. Pain/Pressure: Present     Pelvic:  Cervical exam performed Dilation: Closed Effacement (%): Thick Station: -3  Extremities: Normal range of motion.  Edema: None  Mental Status: Normal mood and affect. Normal behavior. Normal judgment and thought content.   Assessment and Plan:  Pregnancy: G2P1001 at 3380w1d  1. Supervision of other normal pregnancy, antepartum FHT and FH normal. Cervical exam done - reassuring. Discussed when to be seen at hospital - if having increased contractions (4-5 an hour).  Preterm labor symptoms and general obstetric precautions including but not limited to vaginal bleeding, contractions, leaking of fluid and fetal movement were reviewed in detail with  the patient. Please refer to After Visit Summary for other counseling recommendations.  Return in about 2 weeks (around 05/08/2016).   Levie HeritageJacob J Jammy Stlouis, DO

## 2016-04-25 ENCOUNTER — Telehealth: Payer: Self-pay

## 2016-04-25 ENCOUNTER — Encounter: Payer: Self-pay | Admitting: *Deleted

## 2016-04-25 NOTE — Telephone Encounter (Signed)
Patient called in with questions about lab results.

## 2016-05-09 ENCOUNTER — Encounter: Payer: Medicaid Other | Admitting: Certified Nurse Midwife

## 2016-05-12 ENCOUNTER — Ambulatory Visit (INDEPENDENT_AMBULATORY_CARE_PROVIDER_SITE_OTHER): Payer: Medicaid Other | Admitting: Certified Nurse Midwife

## 2016-05-12 VITALS — BP 103/68 | HR 80 | Temp 98.7°F | Wt 159.7 lb

## 2016-05-12 DIAGNOSIS — Z3483 Encounter for supervision of other normal pregnancy, third trimester: Secondary | ICD-10-CM

## 2016-05-12 DIAGNOSIS — Z3480 Encounter for supervision of other normal pregnancy, unspecified trimester: Secondary | ICD-10-CM

## 2016-05-12 NOTE — Progress Notes (Signed)
   PRENATAL VISIT NOTE  Subjective:  Erica Hahn is a 26 y.o. G2P1001 at 2646w5d being seen today for ongoing prenatal care.  She is currently monitored for the following issues for this low-risk pregnancy and has PID (acute pelvic inflammatory disease) and Supervision of other normal pregnancy, antepartum on her problem list.  Patient reports no complaints.  Contractions: Irregular. Vag. Bleeding: None.  Movement: Present. Denies leaking of fluid.   The following portions of the patient's history were reviewed and updated as appropriate: allergies, current medications, past family history, past medical history, past social history, past surgical history and problem list. Problem list updated.  Objective:   Vitals:   05/12/16 1548  BP: 103/68  Pulse: 80  Temp: 98.7 F (37.1 C)  Weight: 159 lb 11.2 oz (72.4 kg)    Fetal Status: Fetal Heart Rate (bpm): 130 Fundal Height: 30 cm Movement: Present  Presentation: Vertex  General:  Alert, oriented and cooperative. Patient is in no acute distress.  Skin: Skin is warm and dry. No rash noted.   Cardiovascular: Normal heart rate noted  Respiratory: Normal respiratory effort, no problems with respiration noted  Abdomen: Soft, gravid, appropriate for gestational age. Pain/Pressure: Present     Pelvic:  Cervical exam deferred        Extremities: Normal range of motion.  Edema: Trace  Mental Status: Normal mood and affect. Normal behavior. Normal judgment and thought content.   Assessment and Plan:  Pregnancy: G2P1001 at 6646w5d  1. Supervision of other normal pregnancy, antepartum Linden DolinWaterbirth as an option discussed and information given about water birth class.   Preterm labor symptoms and general obstetric precautions including but not limited to vaginal bleeding, contractions, leaking of fluid and fetal movement were reviewed in detail with the patient. Please refer to After Visit Summary for other counseling recommendations.  Return in  about 2 weeks (around 05/26/2016) for ROB.   Roe Coombsachelle A Aniyah Nobis, CNM

## 2016-05-12 NOTE — Patient Instructions (Signed)
Thinking About Waterbirth???  You must attend a Waterbirth class at [facility]  3rd Wednesday of every month from 7-9pm  Free  Register by calling [phone] or online at [email or web address]  Bring us the certificate from the class  Waterbirth supplies needed for [facility] Department patients:  Our practice has a Birth Pool in a Box tub at the hospital that you can borrow  You will need to purchase an accessory kit that has all needed supplies through [facility] [employer] ([phone]) or online $175.00  Or you can purchase the supplies separately: o Single-use disposable tub liner for Birth Pool in a Box (REGULAR size) o New garden hose labeled "lead-free", "suitable for drinking water", o Electric drain pump to remove water (We recommend 792 gallon per hour or greater pump.)  o  "non-toxic" OR "water potable" o Garden hose to remove the dirty water o Fish net o Bathing suit top (optional) o Long-handled mirror (optional)  [employer] sells tubs for ~ $120 if you would rather purchase your own tub.  They also sell accessories, liners.    Www.waterbirthsolutions.com for tub purchases and supplies  [employer] (www.thelaborladies.com) $275 for tub rental/set-up & take down/kit   [employer] information regarding doulas (labor support) who provide pool rentals:  Http://www.padanc.org/MeetUs.htm   [employer] (www.thelaborladies.com)  Http://www.padanc.org/MeetUs.htm   Things that would prevent you from having a waterbirth:  Premature, <37wks  Previous cesarean birth  Presence of thick meconium-stained fluid  Multiple gestation (Twins, triplets, etc.)  Uncontrolled diabetes or gestational diabetes requiring medication  Hypertension  Heavy vaginal bleeding  Non-reassuring fetal heart rate  Active infection (MRSA, etc.)  If your labor has to be induced and induction method requires continuous  monitoring of the baby's heart rate  Other risks/issues identified by your obstetrical provider   

## 2016-05-27 ENCOUNTER — Encounter: Payer: Medicaid Other | Admitting: Certified Nurse Midwife

## 2016-06-03 ENCOUNTER — Ambulatory Visit (INDEPENDENT_AMBULATORY_CARE_PROVIDER_SITE_OTHER): Payer: Medicaid Other | Admitting: Obstetrics & Gynecology

## 2016-06-03 VITALS — BP 118/74 | HR 96 | Temp 97.7°F | Wt 168.6 lb

## 2016-06-03 DIAGNOSIS — Z3480 Encounter for supervision of other normal pregnancy, unspecified trimester: Secondary | ICD-10-CM

## 2016-06-03 DIAGNOSIS — Z3483 Encounter for supervision of other normal pregnancy, third trimester: Secondary | ICD-10-CM

## 2016-06-03 NOTE — Progress Notes (Addendum)
   PRENATAL VISIT NOTE  Subjective:  Erica Hahn is a 26 y.o. G2P1001 at 9619w6d being seen today for ongoing prenatal care.  She is currently monitored for the following issues for this low-risk pregnancy and has PID (acute pelvic inflammatory disease) and Supervision of other normal pregnancy, antepartum on her problem list.  Patient reports occasional contractions, desires check.  Contractions: Irregular. Vag. Bleeding: None.  Movement: Present. Denies leaking of fluid.   The following portions of the patient's history were reviewed and updated as appropriate: allergies, current medications, past family history, past medical history, past social history, past surgical history and problem list. Problem list updated.  Objective:   Vitals:   06/03/16 1550  BP: 118/74  Pulse: 96  Temp: 97.7 F (36.5 C)  Weight: 168 lb 9.6 oz (76.5 kg)    Fetal Status: Fetal Heart Rate (bpm): 145 Fundal Height: 34 cm Movement: Present  Presentation: Vertex  General:  Alert, oriented and cooperative. Patient is in no acute distress.  Skin: Skin is warm and dry. No rash noted.   Cardiovascular: Normal heart rate noted  Respiratory: Normal respiratory effort, no problems with respiration noted  Abdomen: Soft, gravid, appropriate for gestational age. Pain/Pressure: Present     Pelvic:  Cervical exam performed Dilation: Closed Effacement (%): Thick Station: -3  Extremities: Normal range of motion.  Edema: Trace  Mental Status: Normal mood and affect. Normal behavior. Normal judgment and thought content.   Assessment and Plan:  Pregnancy: G2P1001 at 119w6d  Supervision of other normal pregnancy, antepartum Preterm labor symptoms and general obstetric precautions including but not limited to vaginal bleeding, contractions, leaking of fluid and fetal movement were reviewed in detail with the patient. Please refer to After Visit Summary for other counseling recommendations.  Return in about 2 weeks  (around 06/17/2016) for OB Visit, Pelvic cultures.   Tereso NewcomerUgonna A Dhilan Brauer, MD

## 2016-06-03 NOTE — Patient Instructions (Signed)
Return to clinic for any scheduled appointments or obstetric concerns, or go to [facility] for evaluation  

## 2016-06-19 ENCOUNTER — Ambulatory Visit (INDEPENDENT_AMBULATORY_CARE_PROVIDER_SITE_OTHER): Payer: Medicaid Other | Admitting: Obstetrics & Gynecology

## 2016-06-19 ENCOUNTER — Other Ambulatory Visit (HOSPITAL_COMMUNITY)
Admission: RE | Admit: 2016-06-19 | Discharge: 2016-06-19 | Disposition: A | Discharge status: Home / Self Care | Payer: Medicaid Other | Source: Ambulatory Visit | Attending: Obstetrics & Gynecology | Admitting: Obstetrics & Gynecology

## 2016-06-19 VITALS — BP 132/82 | HR 85 | Wt 174.8 lb

## 2016-06-19 DIAGNOSIS — D573 Sickle-cell trait: Secondary | ICD-10-CM | POA: Diagnosis not present

## 2016-06-19 DIAGNOSIS — Z113 Encounter for screening for infections with a predominantly sexual mode of transmission: Secondary | ICD-10-CM | POA: Insufficient documentation

## 2016-06-19 DIAGNOSIS — Z3483 Encounter for supervision of other normal pregnancy, third trimester: Secondary | ICD-10-CM

## 2016-06-19 DIAGNOSIS — O99013 Anemia complicating pregnancy, third trimester: Secondary | ICD-10-CM | POA: Diagnosis not present

## 2016-06-19 DIAGNOSIS — Z3480 Encounter for supervision of other normal pregnancy, unspecified trimester: Secondary | ICD-10-CM

## 2016-06-19 LAB — POCT URINALYSIS DIPSTICK
BILIRUBIN UA: NEGATIVE
Blood, UA: NEGATIVE
Glucose, UA: NEGATIVE
KETONES UA: NEGATIVE
LEUKOCYTES UA: NEGATIVE
Nitrite, UA: NEGATIVE
PH UA: 6
Protein, UA: NEGATIVE
SPEC GRAV UA: 1.01
Urobilinogen, UA: 0.2

## 2016-06-19 NOTE — Progress Notes (Signed)
   PRENATAL VISIT NOTE  Subjective:  Erica Hahn is a 26 y.o. G2P1001 at 3622w1d being seen today for ongoing prenatal care.  She is currently monitored for the following issues for this low-risk pregnancy and has PID (acute pelvic inflammatory disease) and Supervision of other normal pregnancy, antepartum on her problem list.  Patient reports no complaints.  Contractions: Irregular. Vag. Bleeding: None.  Movement: Present. Denies leaking of fluid.   The following portions of the patient's history were reviewed and updated as appropriate: allergies, current medications, past family history, past medical history, past social history, past surgical history and problem list. Problem list updated.  Objective:   Vitals:   06/19/16 1554  BP: 132/82  Pulse: 85  Weight: 174 lb 12.5 oz (79.3 kg)    Fetal Status: Fetal Heart Rate (bpm): 150   Movement: Present     General:  Alert, oriented and cooperative. Patient is in no acute distress.  Skin: Skin is warm and dry. No rash noted.   Cardiovascular: Normal heart rate noted  Respiratory: Normal respiratory effort, no problems with respiration noted  Abdomen: Soft, gravid, appropriate for gestational age. Pain/Pressure: Present     Pelvic:  Cervical exam performed        Extremities: Normal range of motion.  Edema: Trace  Mental Status: Normal mood and affect. Normal behavior. Normal judgment and thought content.   Assessment and Plan:  Pregnancy: G2P1001 at 4822w1d  1. Supervision of other normal pregnancy, antepartum  - Culture, beta strep (group b only) - Urine cytology ancillary only  2. Sickle cell trait (HCC)  - Urinalysis - POCT urinalysis dipstick  Preterm labor symptoms and general obstetric precautions including but not limited to vaginal bleeding, contractions, leaking of fluid and fetal movement were reviewed in detail with the patient. Please refer to After Visit Summary for other counseling recommendations.  Return  in about 1 week (around 06/26/2016).   Adam PhenixJames G Miranda Frese, MD

## 2016-06-19 NOTE — Patient Instructions (Signed)

## 2016-06-19 NOTE — Progress Notes (Signed)
No concerns today per pt.  

## 2016-06-23 ENCOUNTER — Other Ambulatory Visit: Payer: Self-pay | Admitting: Obstetrics and Gynecology

## 2016-06-23 ENCOUNTER — Telehealth: Payer: Self-pay

## 2016-06-23 LAB — CULTURE, BETA STREP (GROUP B ONLY): Strep Gp B Culture: NEGATIVE

## 2016-06-23 LAB — URINE CYTOLOGY ANCILLARY ONLY
Chlamydia: NEGATIVE
NEISSERIA GONORRHEA: NEGATIVE

## 2016-06-23 MED ORDER — WRIST BRACE/LEFT MEDIUM MISC: 0 refills | Status: DC

## 2016-06-23 MED ORDER — WRIST BRACE/RIGHT MEDIUM MISC: 0 refills | Status: DC

## 2016-06-23 NOTE — Telephone Encounter (Signed)
Patient called in with concerns about rx for wrist bandages. She states that this was discussed at her last appt and thought she would have a rx at her pharmacy, notified provider and rx was sent.

## 2016-06-30 DIAGNOSIS — O4590 Premature separation of placenta, unspecified, unspecified trimester: Secondary | ICD-10-CM | POA: Insufficient documentation

## 2016-06-30 DIAGNOSIS — O26893 Other specified pregnancy related conditions, third trimester: Secondary | ICD-10-CM

## 2016-06-30 DIAGNOSIS — O99892 Other specified diseases and conditions complicating childbirth: Secondary | ICD-10-CM

## 2016-06-30 DIAGNOSIS — O99019 Anemia complicating pregnancy, unspecified trimester: Secondary | ICD-10-CM

## 2016-06-30 HISTORY — DX: Premature separation of placenta, unspecified, unspecified trimester: O45.90

## 2016-06-30 HISTORY — DX: Other specified pregnancy related conditions, third trimester: O26.893

## 2016-06-30 HISTORY — DX: Anemia complicating pregnancy, unspecified trimester: O99.019

## 2016-06-30 HISTORY — DX: Other specified diseases and conditions complicating childbirth: O99.892

## 2016-07-02 ENCOUNTER — Encounter: Payer: Medicaid Other | Admitting: Obstetrics and Gynecology

## 2016-07-11 ENCOUNTER — Encounter: Payer: Self-pay | Admitting: Certified Nurse Midwife

## 2016-07-11 ENCOUNTER — Ambulatory Visit (INDEPENDENT_AMBULATORY_CARE_PROVIDER_SITE_OTHER): Payer: Medicaid Other | Admitting: Certified Nurse Midwife

## 2016-07-11 VITALS — BP 152/91 | HR 122 | Wt 153.7 lb

## 2016-07-11 DIAGNOSIS — Z30013 Encounter for initial prescription of injectable contraceptive: Secondary | ICD-10-CM

## 2016-07-11 DIAGNOSIS — Z98891 History of uterine scar from previous surgery: Secondary | ICD-10-CM

## 2016-07-11 MED ORDER — MEDROXYPROGESTERONE ACETATE 150 MG/ML IM SUSP: 150.0000 mg | INTRAMUSCULAR | 4 refills | Status: DC

## 2016-07-11 MED ORDER — CITRANATAL BLOOM 90-1 MG PO TABS: 1.0000 | ORAL_TABLET | Freq: Every day | ORAL | 12 refills | Status: DC

## 2016-07-11 MED ORDER — AMOXICILLIN-POT CLAVULANATE 875-125 MG PO TABS: 1.0000 | ORAL_TABLET | Freq: Two times a day (BID) | ORAL | 0 refills | Status: DC

## 2016-07-11 NOTE — Progress Notes (Signed)
Patient presents for Incision Check.

## 2016-07-11 NOTE — Progress Notes (Signed)
Patient ID: Erica Hahn, female   DOB: [birth year], 26 y.o.   MRN: [id number]  Chief Complaint  Patient presents with  . Wound Check    Incision Check    HPI Erica Hahn is a 26 y.o. female.  Here for wound evaluation, had emergency C-section for suspected abruption on Feb. 24th in [place] [facility] when she was visiting family.  C-section was at 37.[redacted] weeks EGA.  Infant was in NICU for a week at [facility] in [place], [place] for respiratory distress, doing well now.  Does not have a lot of support in the area.  Blood pressure elevated today, no hx of elevated blood pressures in pregnancy, encouraged to check her blood pressure in a few days.  Baby love nurse visit ordered for next week.  Will f/u in 2 weeks for re-evaluation of blood pressure.  Baby out of NICU and doing well now.    HPI  Past Medical History:  Diagnosis Date  . Scoliosis     Past Surgical History:  Procedure Laterality Date  . BACK SURGERY      Family History  Problem Relation Age of Onset  . Hypertension Mother   . Hypertension Father     Social History Social History  Substance Use Topics  . Smoking status: Never Smoker  . Smokeless tobacco: Never Used  . Alcohol use 0.6 oz/week    1 Glasses of wine per week    Not on File  Current Outpatient Prescriptions  Medication Sig Dispense Refill  . amoxicillin-clavulanate (AUGMENTIN) 875-125 MG tablet Take 1 tablet by mouth 2 (two) times daily. 14 tablet 0  . Elastic Bandages & Supports (WRIST BRACE/LEFT MEDIUM) MISC Use at bedtime 1 each 0  . Elastic Bandages & Supports (WRIST BRACE/RIGHT MEDIUM) MISC Use at bedtime 1 each 0  . medroxyPROGESTERone (DEPO-PROVERA) 150 MG/ML injection Inject 1 mL (150 mg total) into the muscle every 3 (three) months. 1 mL 4  . Prenatal Vit-Fe Fumarate-FA (PREPLUS) 27-1 MG TABS Take 1 tablet by mouth daily. 30 tablet 13  . Prenatal-DSS-FeCb-FeGl-FA (CITRANATAL BLOOM) 90-1 MG TABS Take 1 tablet by mouth at bedtime.  30 tablet 12   No current facility-administered medications for this visit.     Review of Systems Review of Systems Constitutional: negative for fatigue and weight loss Respiratory: negative for cough and wheezing Cardiovascular: negative for chest pain, fatigue and palpitations Gastrointestinal: negative for abdominal pain and change in bowel habits Genitourinary:negative Integument/breast: negative for nipple discharge Musculoskeletal:negative for myalgias Neurological: negative for gait problems and tremors Behavioral/Psych: negative for abusive relationship, depression Endocrine: negative for temperature intolerance      Blood pressure (!) 152/91, pulse (!) 122, weight 153 lb 11.2 oz (69.7 kg), last menstrual period 10/10/2015.  Physical Exam Physical Exam General:   alert  Skin:   no rash or abnormalities  Lungs:   clear to auscultation bilaterally  Heart:   regular rate and rhythm, S1, S2 normal, no murmur, click, rub or gallop  Breasts:   deferred  Abdomen:  normal findings: no organomegaly, soft, non-tender and no hernia C-section wound with small area of visible subcuticular skin visible: redressed and cleansed with betadine/benzoine and steri strips.  No s/s infection: no tenderness to touch.   Pelvis:  deferred    50% of 20 min visit spent on counseling and coordination of care.    Data Reviewed Previous medical hx, meds, labs  Assessment     Elevated blood pressure today C-section wound  healing: small area not well approximated: Antibiotics given as a precaution.   Contraception management.   Plan    Orders Placed This Encounter  Procedures  . Pecola LeisureBaby Love Nurse Visit    Standing Status:   Future    Standing Expiration Date:   08/11/2016    Scheduling Instructions:     Please see patient next week for blood pressure check, elevated today.  Delivered in [place].   Meds ordered this encounter  Medications  . amoxicillin-clavulanate (AUGMENTIN) 875-125 MG  tablet    Sig: Take 1 tablet by mouth 2 (two) times daily.    Dispense:  14 tablet    Refill:  0  . Prenatal-DSS-FeCb-FeGl-FA (CITRANATAL BLOOM) 90-1 MG TABS    Sig: Take 1 tablet by mouth at bedtime.    Dispense:  30 tablet    Refill:  12  . medroxyPROGESTERone (DEPO-PROVERA) 150 MG/ML injection    Sig: Inject 1 mL (150 mg total) into the muscle every 3 (three) months.    Dispense:  1 mL    Refill:  4    Need to obtain previous records for delivery.   Possible management options include:  [facility] Follow up in 2 weeks.

## 2016-07-16 ENCOUNTER — Inpatient Hospital Stay (HOSPITAL_COMMUNITY)
Admission: AD | Admit: 2016-07-16 | Payer: Medicaid Other | Source: Ambulatory Visit | Admitting: Obstetrics & Gynecology

## 2016-07-24 ENCOUNTER — Ambulatory Visit: Payer: Medicaid Other | Admitting: Certified Nurse Midwife

## 2016-07-29 ENCOUNTER — Ambulatory Visit (INDEPENDENT_AMBULATORY_CARE_PROVIDER_SITE_OTHER): Payer: Medicaid Other | Admitting: Certified Nurse Midwife

## 2016-07-29 ENCOUNTER — Encounter: Payer: Self-pay | Admitting: Certified Nurse Midwife

## 2016-07-29 DIAGNOSIS — Z98891 History of uterine scar from previous surgery: Secondary | ICD-10-CM | POA: Insufficient documentation

## 2016-07-29 NOTE — Progress Notes (Signed)
..  Post Partum Exam  Erica Hahn is a 26 y.o. 452P1001 female who presents for a postpartum visit. She is 4 weeks postpartum following a low cervical transverse Cesarean section. I have fully reviewed the prenatal and intrapartum course. The delivery was at 37.[redacted] weeks gestational weeks, in [place] by emergency C-section for suspected abruption.  Anesthesia: spinal. Postpartum course has been normal. Baby's course has been normal after NICU discharge. Baby is feeding by breast. Bleeding no bleeding. Bowel function is normal. Bladder function is normal. Patient is sexually active. Contraception method is none. Postpartum depression screening:neg  The following portions of the patient's history were reviewed and updated as appropriate: allergies, current medications, past family history, past medical history, past social history, past surgical history and problem list.  Review of Systems Pertinent items noted in HPI and remainder of comprehensive ROS otherwise negative.    Objective:  Last menstrual period 10/10/2015.  General:  alert, cooperative and no distress   Breasts:  inspection negative, no nipple discharge or bleeding, no masses or nodularity palpable  Lungs: clear to auscultation bilaterally  Heart:  regular rate and rhythm, S1, S2 normal, no murmur, click, rub or gallop  Abdomen: soft, non-tender; bowel sounds normal; no masses,  no organomegaly.  C-section wound: C/D/I, 3 inch section of granulation tissue present: Dr. Clearance CootsHarper evaluated.  Pelvic Exam: Not performed.        Assessment:    Normal 4 week postpartum exam. Pap smear not done at today's visit.   Granulation of C-section wound: Nitrazine applied.   Plan:   1. Contraception: abstinence encouraged.  Last reported sexual intercourse on Saturday.  Depo injections planned.  2.  Abstinence and condoms encouraged so that Depo injections can be started.  3. Follow up in: 2 weeks to start on Depo injections and incision  check, may need another application of Nitrazine to wound bed.

## 2016-08-12 ENCOUNTER — Encounter: Payer: Self-pay | Admitting: Obstetrics and Gynecology

## 2016-08-12 ENCOUNTER — Ambulatory Visit (INDEPENDENT_AMBULATORY_CARE_PROVIDER_SITE_OTHER): Payer: Medicaid Other | Admitting: Obstetrics and Gynecology

## 2016-08-12 VITALS — BP 117/78 | HR 64 | Ht 64.0 in | Wt 146.7 lb

## 2016-08-12 DIAGNOSIS — Z3042 Encounter for surveillance of injectable contraceptive: Secondary | ICD-10-CM

## 2016-08-12 DIAGNOSIS — Z3202 Encounter for pregnancy test, result negative: Secondary | ICD-10-CM | POA: Diagnosis not present

## 2016-08-12 LAB — POCT URINE PREGNANCY: Preg Test, Ur: NEGATIVE

## 2016-08-12 MED ORDER — MEDROXYPROGESTERONE ACETATE 150 MG/ML IM SUSP
150.0000 mg | INTRAMUSCULAR | Status: DC
  Administered 2016-08-12 – 2018-08-05 (×4): 150 mg via INTRAMUSCULAR

## 2016-08-12 NOTE — Progress Notes (Signed)
Pt here for Depo Provera. She has had IC UPT today is negative Also for incision check. She had some granulation tissue noted on PP visit and treated with silver nitrate. She has no complaints today  PE  Incision healing well a few small areas of granulation tissue treated with silver nitrate.  A/P Incision check Contraception Management.  Depo Provera today. Pt instructed on incision care. F/U q 12 weeks for Depo Provera and PRN for incision check

## 2016-08-12 NOTE — Patient Instructions (Signed)

## 2016-10-28 ENCOUNTER — Ambulatory Visit: Payer: Medicaid Other

## 2016-11-10 ENCOUNTER — Ambulatory Visit (INDEPENDENT_AMBULATORY_CARE_PROVIDER_SITE_OTHER): Payer: Medicaid Other

## 2016-11-10 DIAGNOSIS — Z3042 Encounter for surveillance of injectable contraceptive: Secondary | ICD-10-CM

## 2016-11-10 DIAGNOSIS — Z3202 Encounter for pregnancy test, result negative: Secondary | ICD-10-CM | POA: Diagnosis not present

## 2016-11-10 LAB — POCT URINE PREGNANCY: Preg Test, Ur: NEGATIVE

## 2016-11-10 MED ORDER — MEDROXYPROGESTERONE ACETATE 150 MG/ML IM SUSP: 150.0000 mg | Freq: Once | INTRAMUSCULAR | Status: DC

## 2016-11-10 NOTE — Progress Notes (Addendum)
Pt supplied Depo given w/o difficulty R Del. Next Depo due 9/24

## 2016-11-11 ENCOUNTER — Ambulatory Visit (INDEPENDENT_AMBULATORY_CARE_PROVIDER_SITE_OTHER): Payer: Medicaid Other | Admitting: Physician Assistant

## 2016-11-25 ENCOUNTER — Encounter (INDEPENDENT_AMBULATORY_CARE_PROVIDER_SITE_OTHER): Payer: Self-pay | Admitting: Physician Assistant

## 2016-11-25 ENCOUNTER — Ambulatory Visit (INDEPENDENT_AMBULATORY_CARE_PROVIDER_SITE_OTHER): Payer: Medicaid Other | Admitting: Physician Assistant

## 2016-11-25 VITALS — BP 120/77 | HR 76 | Temp 99.0°F | Ht 64.0 in | Wt 130.2 lb

## 2016-11-25 DIAGNOSIS — L509 Urticaria, unspecified: Secondary | ICD-10-CM | POA: Diagnosis not present

## 2016-11-25 MED ORDER — EPINEPHRINE 0.3 MG/0.3ML IJ SOAJ: 0.3000 mg | Freq: Once | INTRAMUSCULAR | 1 refills | Status: AC

## 2016-11-25 MED ORDER — LORATADINE 10 MG PO TABS: 10.0000 mg | ORAL_TABLET | Freq: Every day | ORAL | 0 refills | Status: DC

## 2016-11-25 MED ORDER — DIPHENHYDRAMINE HCL 25 MG PO TABS: 25.0000 mg | ORAL_TABLET | Freq: Four times a day (QID) | ORAL | 0 refills | Status: DC | PRN

## 2016-11-25 NOTE — Progress Notes (Signed)
Pt complains of breaking out in hives since June. Believes it is attributed to tomatoes. Primarily breaks out at night

## 2016-11-25 NOTE — Patient Instructions (Addendum)
Allergies, Adult An allergy is when your body's defense system (immune system) overreacts to an otherwise harmless substance (allergen) that you breathe in or eat or something that touches your skin. When you come into contact with something that you are allergic to, your immune system produces certain proteins (antibodies). These proteins cause cells to release chemicals (histamines) that trigger the symptoms of an allergic reaction. Allergies often affect the nasal passages (allergic rhinitis), eyes (allergic conjunctivitis), skin (atopic dermatitis), and stomach. Allergies can be mild or severe. Allergies cannot spread from person to person (are not contagious). They can develop at any age and may be outgrown. What increases the risk? You may be at greater risk of allergies if other people in your family have allergies. What are the signs or symptoms? Symptoms depend on what type of allergy you have. They may include:  Runny, stuffy nose.  Sneezing.  Itchy mouth, ears, or throat.  Postnasal drip.  Sore throat.  Itchy, red, watery, or puffy eyes.  Skin rash or hives.  Stomach pain.  Vomiting.  Diarrhea.  Bloating.  Wheezing or coughing.  People with a severe allergy to food, medicine, or an insect bite may have a life-threatening allergic reaction (anaphylaxis). Symptoms of anaphylaxis include:  Hives.  Itching.  Flushed face.  Swollen lips, tongue, or mouth.  Tight or swollen throat.  Chest pain or tightness in the chest.  Trouble breathing or shortness of breath.  Rapid heartbeat.  Dizziness or fainting.  Vomiting.  Diarrhea.  Pain in the abdomen.  How is this diagnosed? This condition is diagnosed based on:  Your symptoms.  Your family and medical history.  A physical exam.  You may need to see a health care provider who specializes in treating allergies (allergist). You may also have tests, including:  Skin tests to see which allergens are  causing your symptoms, such as: ? Skin prick test. In this test, your skin is pricked with a tiny needle and exposed to small amounts of possible allergens to see if your skin reacts. ? Intradermal skin test. In this test, a small amount of allergen is injected under your skin to see if your skin reacts. ? Patch test. In this test, a small amount of allergen is placed on your skin and then your skin is covered with a bandage. Your health care provider will check your skin after a couple of days to see if a rash has developed.  Blood tests.  Challenges tests. In this test, you inhale a small amount of allergen by mouth to see if you have an allergic reaction.  You may also be asked to:  Keep a food diary. A food diary is a record of all the foods and drinks you have in a day and any symptoms you experience.  Practice an elimination diet. An elimination diet involves eliminating specific foods from your diet and then adding them back in one by one to find out if a certain food causes an allergic reaction.  How is this treated? Treatment for allergies depends on your symptoms. Treatment may include:  Cold compresses to soothe itching and swelling.  Eye drops.  Nasal sprays.  Using a saline spray or container (neti pot) to flush out the nose (nasal irrigation). These methods can help clear away mucus and keep the nasal passages moist.  Using a humidifier.  Oral antihistamines or other medicines to block allergic reaction and inflammation.  Skin creams to treat rashes or itching.  Diet changes to   eliminate food allergy triggers.  Repeated exposure to tiny amounts of allergens to build up a tolerance and prevent future allergic reactions (immunotherapy). These include: ? Allergy shots. ? Oral treatment. This involves taking small doses of an allergen under the tongue (sublingual immunotherapy).  Emergency epinephrine injection (auto-injector) in case of an allergic emergency. This is  a self-injectable, pre-measured medicine that must be given within the first few minutes of a serious allergic reaction.  Follow these instructions at home:  Avoid known allergens whenever possible.  If you suffer from airborne allergens, wash out your nose daily. You can do this with a saline spray or a neti pot to flush out your nose (nasal irrigation).  Take over-the-counter and prescription medicines only as told by your health care provider.  Keep all follow-up visits as told by your health care provider. This is important.  If you are at risk of a severe allergic reaction (anaphylaxis), keep your auto-injector with you at all times.  If you have ever had anaphylaxis, wear a medical alert bracelet or necklace that states you have a severe allergy. Contact a health care provider if:  Your symptoms do not improve with treatment. Get help right away if:  You have symptoms of anaphylaxis, such as: ? Swollen mouth, tongue, or throat. ? Pain or tightness in your chest. ? Trouble breathing or shortness of breath. ? Dizziness or fainting. ? Severe abdominal pain, vomiting, or diarrhea. This information is not intended to replace advice given to you by your health care provider. Make sure you discuss any questions you have with your health care provider. Document Released: 07/15/2002 Document Revised: 12/20/2015 Document Reviewed: 11/07/2015 Elsevier Interactive Patient Education  2018 [employer] Epinephrine injection (Auto-injector) What is this medicine? EPINEPHRINE (ep i NEF rin) is used for the emergency treatment of severe allergic reactions. You should keep this medicine with you at all times. This medicine may be used for other purposes; ask your health care provider or pharmacist if you have questions. COMMON BRAND NAME(S): Adrenaclick, Auvi-Q, epinephrinesnap, epinephrinesnap-v, EpiPen, EPIsnap Epinephrine, SYMJEPI, Twinject What should I tell my health care provider before  I take this medicine? They need to know if you have any of the following conditions: -diabetes -heart disease -high blood pressure -lung or breathing disease, like asthma -Parkinson's disease -thyroid disease -an unusual or allergic reaction to epinephrine, sulfites, other medicines, foods, dyes, or preservatives -pregnant or trying to get pregnant -breast-feeding How should I use this medicine? This medicine is for injection into the outer thigh. Your doctor or health care professional will instruct you on the proper use of the device during an emergency. Read all directions carefully and make sure you understand them. Do not use more often than directed. Talk to your pediatrician regarding the use of this medicine in children. Special care may be needed. This drug is commonly used in children. A special device is available for use in children. If you are giving this medicine to a young child, hold their leg firmly in place before and during the injection to prevent injury. Overdosage: If you think you have taken too much of this medicine contact a poison control center or emergency room at once. NOTE: This medicine is only for you. Do not share this medicine with others. What if I miss a dose? This does not apply. You should only use this medicine for an allergic reaction. What may interact with this medicine? This medicine is only used during an emergency. Significant drug interactions  are not likely during emergency use. This list may not describe all possible interactions. Give your health care provider a list of all the medicines, herbs, non-prescription drugs, or dietary supplements you use. Also tell them if you smoke, drink alcohol, or use illegal drugs. Some items may interact with your medicine. What should I watch for while using this medicine? Keep this medicine ready for use in the case of a severe allergic reaction. Make sure that you have the phone number of your doctor or  health care professional and local hospital ready. Remember to check the expiration date of your medicine regularly. You may need to have additional units of this medicine with you at work, school, or other places. Talk to your doctor or health care professional about your need for extra units. Some emergencies may require an additional dose. Check with your doctor or a health care professional before using an extra dose. After use, go to the nearest hospital or call 911. Avoid physical activity. Make sure the treating health care professional knows you have received an injection of this medicine. You will receive additional instructions on what to do during and after use of this medicine before a medical emergency occurs. What side effects may I notice from receiving this medicine? Side effects that you should report to your doctor or health care professional as soon as possible: -allergic reactions like skin rash, itching or hives, swelling of the face, lips, or tongue -breathing problems -chest pain -fast, irregular heartbeat -pain, tingling, numbness in the hands or feet -pain, redness, or irritation at site where injected -vomiting Side effects that usually do not require medical attention (report to your doctor or health care professional if they continue or are bothersome): -anxious -dizziness -dry mouth -headache -increased sweating -nausea -unusually weak or tired This list may not describe all possible side effects. Call your doctor for medical advice about side effects. You may report side effects to FDA at 1-800-FDA-1088. Where should I keep my medicine? Keep out of the reach of children. Store at room temperature between 15 and 30 degrees C (59 and 86 degrees F). Protect from light and heat. The solution should be clear in color. If the solution is discolored or contains particles it must be replaced. Throw away any unused medicine after the expiration date. Ask your doctor or  pharmacist about proper disposal of the injector if it is expired or has been used. Always replace your auto-injector before it expires. NOTE: This sheet is a summary. It may not cover all possible information. If you have questions about this medicine, talk to your doctor, pharmacist, or health care provider.  2018 Elsevier/Gold Standard (2014-09-25 12:24:50)

## 2016-11-25 NOTE — Progress Notes (Signed)
Subjective:  Patient ID: Erica Hahn, female    DOB: 1990-07-10  Age: 26 y.o. MRN: [id number]  CC: Hives  HPI Erica Hahn is a 27 y.o. female with a PMH of scoliosis presents with hives. 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 First outbreak one month ago. Took Benadryl with complete resolution. First outbreaks were described as "whelts". Now only has some residual hives at night. No hives or itching currently.     Outpatient Medications Prior to Visit  Medication Sig Dispense Refill  . docusate sodium (COLACE) 100 MG capsule Take 100 mg by mouth 2 (two) times daily.    . ferrous sulfate 325 (65 FE) MG EC tablet Take 325 mg by mouth 3 (three) times daily with meals.     Facility-Administered Medications Prior to Visit  Medication Dose Route Frequency Provider Last Rate Last Dose  . medroxyPROGESTERone (DEPO-PROVERA) injection 150 mg  150 mg Intramuscular Q90 days Hermina Staggers, MD   150 mg at 11/10/16 1624  . medroxyPROGESTERone (DEPO-PROVERA) injection 150 mg  150 mg Intramuscular Once Hermina Staggers, MD         ROS Review of Systems  Constitutional: Negative for chills, fever and malaise/fatigue.  Eyes: Negative for blurred vision.  Respiratory: Negative for shortness of breath.   Cardiovascular: Negative for chest pain and palpitations.  Gastrointestinal: Negative for abdominal pain and nausea.  Genitourinary: Negative for dysuria and hematuria.  Musculoskeletal: Negative for joint pain and myalgias.  Skin: Negative for rash.  Neurological: Negative for tingling and headaches.  Psychiatric/Behavioral: Negative for depression. The patient is not nervous/anxious.     Objective:  BP 120/77 (BP Location: Right Arm, Patient Position: Sitting, Cuff Size: Normal)   Pulse 76   Temp 99 F (37.2 C) (Oral)   Ht 5\' 4"  (1.626 m)   Wt 130 lb 3.2 oz (59.1 kg)   SpO2 99%   Breastfeeding? Yes   BMI  22.35 kg/m   BP/Weight 11/25/2016 08/12/2016 07/29/2016  Systolic BP 120 117 125  Diastolic BP 77 78 66  Wt. (Lbs) 130.2 146.7 -  BMI 22.35 25.18 -      Physical Exam  Constitutional: She is oriented to person, place, and time.  Well developed, well nourished, NAD, polite  HENT:  Head: Normocephalic and atraumatic.  Eyes: No scleral icterus.  Neck: Normal range of motion. Neck supple. No thyromegaly present.  Cardiovascular: Normal rate, regular rhythm and normal heart sounds.   Pulmonary/Chest: Effort normal and breath sounds normal.  Abdominal: Soft. Bowel sounds are normal. She exhibits no distension. There is no tenderness.  Musculoskeletal: She exhibits no edema.  Neurological: She is alert and oriented to person, place, and time.  Skin: Skin is warm and dry. No rash noted. No erythema. No pallor.  Psychiatric: She has a normal mood and affect. Her behavior is normal. Thought content normal.  Vitals reviewed.    Assessment & Plan:   1. Hives - Begin EPINEPHrine (EPIPEN 2-PAK) 0.3 mg/0.3 mL IJ SOAJ injection; Inject 0.3 mLs (0.3 mg total) into the muscle once.  Dispense: 1 Device; Refill: 1 - Begin diphenhydrAMINE (BENADRYL) 25 MG tablet; Take 1 tablet (25 mg total) by mouth every 6 (six) hours as needed.  Dispense: 30 tablet; Refill: 0 - Begin loratadine (CLARITIN) 10 MG tablet; Take 1 tablet (10 mg total) by mouth daily.  Dispense: 30 tablet; Refill: 0 - Allergen Profile, Vegetable II -  Comprehensive metabolic panel - CBC with Differential   Meds ordered this encounter  Medications  . EPINEPHrine (EPIPEN 2-PAK) 0.3 mg/0.3 mL IJ SOAJ injection    Sig: Inject 0.3 mLs (0.3 mg total) into the muscle once.    Dispense:  1 Device    Refill:  1    Order Specific Question:   Supervising Provider    Answer:   Quentin AngstJEGEDE, OLUGBEMIGA E [id number]]  . diphenhydrAMINE (BENADRYL) 25 MG tablet    Sig: Take 1 tablet (25 mg total) by mouth every 6 (six) hours as needed.    Dispense:   30 tablet    Refill:  0    Order Specific Question:   Supervising Provider    Answer:   Quentin AngstJEGEDE, OLUGBEMIGA E [id number]  . loratadine (CLARITIN) 10 MG tablet    Sig: Take 1 tablet (10 mg total) by mouth daily.    Dispense:  30 tablet    Refill:  0    Order Specific Question:   Supervising Provider    Answer:   Quentin AngstJEGEDE, OLUGBEMIGA E [id number]    Follow-up: Return in about 4 weeks (around 12/23/2016) for allergies.   Loletta Specteroger David Flay Ghosh PA

## 2016-11-29 LAB — COMPREHENSIVE METABOLIC PANEL
A/G RATIO: 1.6 (ref 1.2–2.2)
ALT: 9 IU/L (ref 0–32)
AST: 12 IU/L (ref 0–40)
Albumin: 4.5 g/dL (ref 3.5–5.5)
Alkaline Phosphatase: 86 IU/L (ref 39–117)
BUN / CREAT RATIO: 13 (ref 9–23)
BUN: 12 mg/dL (ref 6–20)
Bilirubin Total: 0.7 mg/dL (ref 0.0–1.2)
CALCIUM: 9.7 mg/dL (ref 8.7–10.2)
CO2: 21 mmol/L (ref 20–29)
Chloride: 105 mmol/L (ref 96–106)
Creatinine, Ser: 0.89 mg/dL (ref 0.57–1.00)
GFR, EST AFRICAN AMERICAN: 106 mL/min/{1.73_m2} (ref >59)
GFR, EST NON AFRICAN AMERICAN: 92 mL/min/{1.73_m2} (ref >59)
Globulin, Total: 2.9 g/dL (ref 1.5–4.5)
Glucose: 77 mg/dL (ref 65–99)
POTASSIUM: 3.7 mmol/L (ref 3.5–5.2)
SODIUM: 141 mmol/L (ref 134–144)
TOTAL PROTEIN: 7.4 g/dL (ref 6.0–8.5)

## 2016-11-29 LAB — ALLERGEN PROFILE, VEGETABLE II
Allergen Carrot IgE: <0.1 kU/L
Allergen Green Bean IgE: <0.1 kU/L
Allergen Green Pea IgE: <0.1 kU/L
Allergen Potato, White IgE: <0.1 kU/L
F225-IGE PUMPKIN: 0.17 kU/L — AB
Kidney Bean IgE: <0.1 kU/L
Soybean IgE: <0.1 kU/L

## 2016-11-29 LAB — CBC WITH DIFFERENTIAL/PLATELET
BASOS: 0 %
Basophils Absolute: 0 10*3/uL (ref 0.0–0.2)
EOS (ABSOLUTE): 0.1 10*3/uL (ref 0.0–0.4)
Eos: 2 %
HEMATOCRIT: 36 % (ref 34.0–46.6)
Hemoglobin: 11.7 g/dL (ref 11.1–15.9)
Immature Grans (Abs): 0 10*3/uL (ref 0.0–0.1)
Immature Granulocytes: 0 %
Lymphocytes Absolute: 2.1 10*3/uL (ref 0.7–3.1)
Lymphs: 33 %
MCH: 28.3 pg (ref 26.6–33.0)
MCHC: 32.5 g/dL (ref 31.5–35.7)
MCV: 87 fL (ref 79–97)
MONOS ABS: 0.4 10*3/uL (ref 0.1–0.9)
Monocytes: 5 %
NEUTROS ABS: 3.9 10*3/uL (ref 1.4–7.0)
Neutrophils: 60 %
PLATELETS: 282 10*3/uL (ref 150–379)
RBC: 4.14 x10E6/uL (ref 3.77–5.28)
RDW: 15.8 % — AB (ref 12.3–15.4)
WBC: 6.5 10*3/uL (ref 3.4–10.8)

## 2016-12-09 ENCOUNTER — Encounter (INDEPENDENT_AMBULATORY_CARE_PROVIDER_SITE_OTHER): Payer: Self-pay

## 2017-01-29 ENCOUNTER — Ambulatory Visit: Payer: Medicaid Other

## 2017-02-04 ENCOUNTER — Ambulatory Visit: Payer: Medicaid Other

## 2017-02-09 ENCOUNTER — Ambulatory Visit (INDEPENDENT_AMBULATORY_CARE_PROVIDER_SITE_OTHER): Payer: Medicaid Other

## 2017-02-09 ENCOUNTER — Ambulatory Visit: Payer: Medicaid Other

## 2017-02-09 DIAGNOSIS — Z3042 Encounter for surveillance of injectable contraceptive: Secondary | ICD-10-CM

## 2017-02-09 MED ORDER — MEDROXYPROGESTERONE ACETATE 150 MG/ML IM SUSP
150.0000 mg | Freq: Once | INTRAMUSCULAR | Status: AC
  Administered 2017-02-09: 150 mg via INTRAMUSCULAR

## 2017-02-09 NOTE — Progress Notes (Addendum)
Nurse visit for pt supplied Depo given L del w/o difficulty. Next Depo due Dec 24 - Jan 7 pt agrees. Pap due; pt will schedule annual.

## 2017-02-17 ENCOUNTER — Ambulatory Visit: Payer: Medicaid Other

## 2017-03-31 ENCOUNTER — Ambulatory Visit: Payer: Medicaid Other

## 2017-04-29 ENCOUNTER — Ambulatory Visit: Payer: Medicaid Other

## 2017-05-01 ENCOUNTER — Telehealth: Payer: Self-pay | Admitting: *Deleted

## 2017-05-01 NOTE — Telephone Encounter (Signed)
Attempted to call patient to reschedule or let us know her plans one number unable to leave msgs and the other number is not active.Marland Kitchen..Marland Kitchen

## 2017-05-11 ENCOUNTER — Ambulatory Visit (INDEPENDENT_AMBULATORY_CARE_PROVIDER_SITE_OTHER): Payer: Medicaid Other | Admitting: Pediatrics

## 2017-05-11 DIAGNOSIS — Z3042 Encounter for surveillance of injectable contraceptive: Secondary | ICD-10-CM | POA: Diagnosis not present

## 2017-05-11 MED ORDER — MEDROXYPROGESTERONE ACETATE 150 MG/ML IM SUSP
150.0000 mg | Freq: Once | INTRAMUSCULAR | Status: AC
  Administered 2017-05-11: 150 mg via INTRAMUSCULAR

## 2017-05-11 NOTE — Progress Notes (Signed)
Patient her for depo-provera  Agree with nursing staff's documentation of this patient's clinic encounter.  Catalina AntiguaPeggy Hortense Cantrall, MD 05/11/2017 10:00 AM

## 2017-05-25 ENCOUNTER — Ambulatory Visit: Payer: Medicaid Other | Admitting: Certified Nurse Midwife

## 2017-07-14 ENCOUNTER — Other Ambulatory Visit (INDEPENDENT_AMBULATORY_CARE_PROVIDER_SITE_OTHER): Payer: Self-pay

## 2017-07-14 DIAGNOSIS — Z23 Encounter for immunization: Secondary | ICD-10-CM

## 2017-07-14 NOTE — Progress Notes (Signed)
PPD administered as part of work requirement.

## 2017-07-16 ENCOUNTER — Other Ambulatory Visit (INDEPENDENT_AMBULATORY_CARE_PROVIDER_SITE_OTHER): Payer: Medicaid Other

## 2017-07-16 DIAGNOSIS — Z23 Encounter for immunization: Secondary | ICD-10-CM

## 2017-07-16 LAB — TB SKIN TEST
INDURATION: 0 mm
TB SKIN TEST: NEGATIVE

## 2017-07-21 ENCOUNTER — Encounter (INDEPENDENT_AMBULATORY_CARE_PROVIDER_SITE_OTHER): Payer: Self-pay | Admitting: Physician Assistant

## 2017-07-21 ENCOUNTER — Other Ambulatory Visit (INDEPENDENT_AMBULATORY_CARE_PROVIDER_SITE_OTHER): Payer: Medicaid Other

## 2017-08-04 ENCOUNTER — Ambulatory Visit: Payer: Medicaid Other

## 2017-08-05 ENCOUNTER — Other Ambulatory Visit: Payer: Self-pay

## 2017-08-05 DIAGNOSIS — Z98891 History of uterine scar from previous surgery: Secondary | ICD-10-CM

## 2017-08-05 DIAGNOSIS — Z30013 Encounter for initial prescription of injectable contraceptive: Secondary | ICD-10-CM

## 2017-08-05 MED ORDER — MEDROXYPROGESTERONE ACETATE 150 MG/ML IM SUSP: 150.0000 mg | INTRAMUSCULAR | 0 refills | Status: DC

## 2017-08-06 ENCOUNTER — Ambulatory Visit (INDEPENDENT_AMBULATORY_CARE_PROVIDER_SITE_OTHER): Payer: Medicaid Other | Admitting: Obstetrics

## 2017-08-06 ENCOUNTER — Other Ambulatory Visit: Payer: Self-pay

## 2017-08-06 ENCOUNTER — Other Ambulatory Visit (HOSPITAL_COMMUNITY)
Admission: RE | Admit: 2017-08-06 | Discharge: 2017-08-06 | Disposition: A | Discharge status: Home / Self Care | Payer: Medicaid Other | Source: Ambulatory Visit | Attending: Obstetrics | Admitting: Obstetrics

## 2017-08-06 ENCOUNTER — Encounter: Payer: Self-pay | Admitting: Obstetrics

## 2017-08-06 VITALS — BP 119/71 | HR 78 | Ht 64.0 in | Wt 137.5 lb

## 2017-08-06 DIAGNOSIS — Z3042 Encounter for surveillance of injectable contraceptive: Secondary | ICD-10-CM

## 2017-08-06 DIAGNOSIS — Z01419 Encounter for gynecological examination (general) (routine) without abnormal findings: Secondary | ICD-10-CM

## 2017-08-06 DIAGNOSIS — Z309 Encounter for contraceptive management, unspecified: Secondary | ICD-10-CM | POA: Diagnosis not present

## 2017-08-06 DIAGNOSIS — Z30013 Encounter for initial prescription of injectable contraceptive: Secondary | ICD-10-CM

## 2017-08-06 DIAGNOSIS — N898 Other specified noninflammatory disorders of vagina: Secondary | ICD-10-CM

## 2017-08-06 MED ORDER — MEDROXYPROGESTERONE ACETATE 150 MG/ML IM SUSP: 150.0000 mg | INTRAMUSCULAR | 4 refills | Status: DC

## 2017-08-06 NOTE — Progress Notes (Signed)
Subjective:        Erica Hahn is a 27 y.o. female here for a routine exam.  Current complaints: None.    Personal health questionnaire:  Is patient [name], have a family history of breast and/or ovarian cancer: no Is there a family history of uterine cancer diagnosed at age < 5050, gastrointestinal cancer, urinary tract cancer, family member who is a Personnel officerLynch syndrome-associated carrier: no Is the patient overweight and hypertensive, family history of diabetes, personal history of gestational diabetes, preeclampsia or PCOS: no Is patient over 6555, have PCOS,  family history of premature CHD under age 25, diabetes, smoke, have hypertension or peripheral artery disease:  no At any time, has a partner hit, kicked or otherwise hurt or frightened you?: no Over the past 2 weeks, have you felt down, depressed or hopeless?: no Over the past 2 weeks, have you felt little interest or pleasure in doing things?:no   Gynecologic History No LMP recorded (lmp unknown). Patient has had an injection. Contraception: Depo-Provera injections Last Pap: 2017. Results were: normal Last mammogram: n/a. Results were: n/a  Obstetric History OB History  Gravida Para Term Preterm AB Living  2 1 1    0 1  SAB TAB Ectopic Multiple Live Births    0     1    # Outcome Date GA Lbr Len/2nd Weight Sex Delivery Anes PTL Lv  2 Gravida           1 Term 02/11/12   7 lb 12 oz (3.515 kg) M  Spinal N LIV    Obstetric Comments  Has rods in spine and had to have spinal for delivery last time.      Past Medical History:  Diagnosis Date  . Scoliosis     Past Surgical History:  Procedure Laterality Date  . BACK SURGERY       Current Outpatient Medications:  .  medroxyPROGESTERone (DEPO-PROVERA) 150 MG/ML injection, Inject 1 mL (150 mg total) into the muscle every 3 (three) months., Disp: 1 mL, Rfl: 0 .  diphenhydrAMINE (BENADRYL) 25 MG tablet, Take 1 tablet (25 mg total) by mouth every 6 (six) hours  as needed. (Patient not taking: Reported on 08/06/2017), Disp: 30 tablet, Rfl: 0 .  docusate sodium (COLACE) 100 MG capsule, Take 100 mg by mouth 2 (two) times daily., Disp: , Rfl:  .  ferrous sulfate 325 (65 FE) MG EC tablet, Take 325 mg by mouth 3 (three) times daily with meals., Disp: , Rfl:  .  loratadine (CLARITIN) 10 MG tablet, Take 1 tablet (10 mg total) by mouth daily. (Patient not taking: Reported on 08/06/2017), Disp: 30 tablet, Rfl: 0  Current Facility-Administered Medications:  .  medroxyPROGESTERone (DEPO-PROVERA) injection 150 mg, 150 mg, Intramuscular, Q90 days, Hermina StaggersErvin, Michael L, MD, 150 mg at 08/06/17 1344 .  medroxyPROGESTERone (DEPO-PROVERA) injection 150 mg, 150 mg, Intramuscular, Once, Hermina StaggersErvin, Michael L, MD No Known Allergies  Social History   Tobacco Use  . Smoking status: Never Smoker  . Smokeless tobacco: Never Used  Substance Use Topics  . Alcohol use: Yes    Alcohol/week: 0.6 oz    Types: 1 Glasses of wine per week    Family History  Problem Relation Age of Onset  . Hypertension Mother   . Hypertension Father       Review of Systems  Constitutional: negative for fatigue and weight loss Respiratory: negative for cough and wheezing Cardiovascular: negative for chest pain, fatigue and palpitations Gastrointestinal: negative  for abdominal pain and change in bowel habits Musculoskeletal:negative for myalgias Neurological: negative for gait problems and tremors Behavioral/Psych: negative for abusive relationship, depression Endocrine: negative for temperature intolerance    Genitourinary:negative for abnormal menstrual periods, genital lesions, hot flashes, sexual problems and vaginal discharge Integument/breast: negative for breast lump, breast tenderness, nipple discharge and skin lesion(s)    Objective:       BP 119/71   Pulse 78   Ht 5\' 4"  (1.626 m)   Wt 137 lb 8 oz (62.4 kg)   LMP  (LMP Unknown)   Breastfeeding? Yes   BMI 23.60 kg/m  General:    alert  Skin:   no rash or abnormalities  Lungs:   clear to auscultation bilaterally  Heart:   regular rate and rhythm, S1, S2 normal, no murmur, click, rub or gallop  Breasts:   normal without suspicious masses, skin or nipple changes or axillary nodes  Abdomen:  normal findings: no organomegaly, soft, non-tender and no hernia  Pelvis:  External genitalia: normal general appearance Urinary system: urethral meatus normal and bladder without fullness, nontender Vaginal: normal without tenderness, induration or masses Cervix: normal appearance Adnexa: normal bimanual exam Uterus: anteverted and non-tender, normal size   Lab Review Urine pregnancy test Labs reviewed yes Radiologic studies reviewed no  50% of 20 min visit spent on counseling and coordination of care.   Assessment:     1. Encounter for routine gynecological examination with Papanicolaou smear of cervix Rx: - Cytology - PAP  2. Encounter for surveillance of injectable contraceptive   3. Encounter for initial prescription of injectable contraceptive Rx: - medroxyPROGESTERone (DEPO-PROVERA) 150 MG/ML injection; Inject 1 mL (150 mg total) into the muscle every 3 (three) months.  Dispense: 1 mL; Refill: 4  4. Vaginal discharge Rx: - Cervicovaginal ancillary only   Plan:    Education reviewed: calcium supplements, depression evaluation, low fat, low cholesterol diet, safe sex/STD prevention, self breast exams and weight bearing exercise. Contraception: Depo-Provera injections. Follow up in: 1 year.   No orders of the defined types were placed in this encounter.  No orders of the defined types were placed in this encounter.   Brock Bad MD 08-06-2017

## 2017-08-06 NOTE — Progress Notes (Signed)
Presents for AEX/PAP. DEPO given in right deltoid, tolerated well.  Next DEPO due 6/20-11/05/2017.  Administrations This Visit    medroxyPROGESTERone (DEPO-PROVERA) injection 150 mg    Admin Date 08/06/2017 Action Given Dose 150 mg Route Intramuscular Administered By Maretta BeesMcGlashan, Hamlin Devine J, RMA         Patient needs refills for DEPO.

## 2017-08-07 LAB — CERVICOVAGINAL ANCILLARY ONLY
Chlamydia: NEGATIVE
Neisseria Gonorrhea: NEGATIVE

## 2017-08-10 LAB — CYTOLOGY - PAP
ADEQUACY: ABSENT
DIAGNOSIS: NEGATIVE

## 2017-10-26 ENCOUNTER — Ambulatory Visit: Payer: Medicaid Other

## 2017-10-30 ENCOUNTER — Ambulatory Visit: Payer: Medicaid Other | Admitting: Obstetrics

## 2017-11-04 ENCOUNTER — Ambulatory Visit: Payer: Medicaid Other

## 2017-11-09 ENCOUNTER — Ambulatory Visit (INDEPENDENT_AMBULATORY_CARE_PROVIDER_SITE_OTHER): Payer: Medicaid Other

## 2017-11-09 DIAGNOSIS — Z3202 Encounter for pregnancy test, result negative: Secondary | ICD-10-CM

## 2017-11-09 DIAGNOSIS — Z3042 Encounter for surveillance of injectable contraceptive: Secondary | ICD-10-CM

## 2017-11-09 LAB — POCT URINE PREGNANCY: Preg Test, Ur: NEGATIVE

## 2017-11-09 NOTE — Addendum Note (Signed)
Addended by: Tim LairLARK, LATOYA on: 11/09/2017 02:43 PM   Modules accepted: Orders

## 2017-11-09 NOTE — Progress Notes (Signed)
Pt presents for Depo Restart   Last Depo Injection: 08/06/2017.  Depo was due: 06/20-07/08/2017  UPT: Negative  Pt admits to unprotected intercourse last night.  Pt made aware depo injection will NOT be received today and she needs to refrain from unprotected intercourse and return in 2 weeks for UPT.   Pt voiced understanding.

## 2017-11-26 ENCOUNTER — Ambulatory Visit (INDEPENDENT_AMBULATORY_CARE_PROVIDER_SITE_OTHER): Payer: Medicaid Other

## 2017-11-26 DIAGNOSIS — Z3202 Encounter for pregnancy test, result negative: Secondary | ICD-10-CM | POA: Diagnosis not present

## 2017-11-26 DIAGNOSIS — Z3042 Encounter for surveillance of injectable contraceptive: Secondary | ICD-10-CM

## 2017-11-26 LAB — POCT URINE PREGNANCY: PREG TEST UR: NEGATIVE

## 2017-11-26 MED ORDER — MEDROXYPROGESTERONE ACETATE 150 MG/ML IM SUSP
150.0000 mg | Freq: Once | INTRAMUSCULAR | Status: AC
  Administered 2017-11-26: 150 mg via INTRAMUSCULAR

## 2017-11-26 NOTE — Progress Notes (Signed)
Pt presents for nurse visit to restart Depo. 2nd UPT neg today as well. Pt denies unprotected IC x 14 days. Depo given L Del w/o difficulty. Next depo due 10/10-10/24.

## 2017-11-26 NOTE — Progress Notes (Signed)
I have reviewed this chart and agree with the RN/CMA assessment and management.    K. Meryl Fartun Paradiso, M.D. [facility]  

## 2018-02-11 ENCOUNTER — Ambulatory Visit (INDEPENDENT_AMBULATORY_CARE_PROVIDER_SITE_OTHER): Payer: Medicaid Other

## 2018-02-11 VITALS — BP 123/75 | HR 77 | Wt 138.2 lb

## 2018-02-11 DIAGNOSIS — Z3042 Encounter for surveillance of injectable contraceptive: Secondary | ICD-10-CM

## 2018-02-11 MED ORDER — MEDROXYPROGESTERONE ACETATE 150 MG/ML IM SUSP
150.0000 mg | Freq: Once | INTRAMUSCULAR | Status: AC
  Administered 2018-02-11: 150 mg via INTRAMUSCULAR

## 2018-02-11 NOTE — Progress Notes (Addendum)
Presents for DEPO, given in LD, tolerated well.  Next DEPO 12/26-01/01/2019.  Lot: [id number] Exp 03/2018  Administrations This Visit    medroxyPROGESTERone (DEPO-PROVERA) injection 150 mg    Admin Date 02/11/2018 Action Given Dose 150 mg Route Intramuscular Administered By Maretta Bees, RMA

## 2018-03-08 ENCOUNTER — Encounter (HOSPITAL_COMMUNITY): Payer: Self-pay | Admitting: Emergency Medicine

## 2018-03-08 ENCOUNTER — Emergency Department (HOSPITAL_COMMUNITY)
Admission: EM | Admit: 2018-03-08 | Discharge: 2018-03-08 | Disposition: A | Discharge status: Home / Self Care | Payer: Medicaid Other | Attending: Emergency Medicine | Admitting: Emergency Medicine

## 2018-03-08 ENCOUNTER — Other Ambulatory Visit: Payer: Self-pay

## 2018-03-08 DIAGNOSIS — Z79899 Other long term (current) drug therapy: Secondary | ICD-10-CM | POA: Diagnosis not present

## 2018-03-08 DIAGNOSIS — R300 Dysuria: Secondary | ICD-10-CM | POA: Diagnosis present

## 2018-03-08 DIAGNOSIS — N3001 Acute cystitis with hematuria: Secondary | ICD-10-CM | POA: Insufficient documentation

## 2018-03-08 HISTORY — DX: Acute parametritis and pelvic cellulitis: N73.0

## 2018-03-08 LAB — URINALYSIS, ROUTINE W REFLEX MICROSCOPIC
BILIRUBIN URINE: NEGATIVE
GLUCOSE, UA: NEGATIVE mg/dL
Ketones, ur: NEGATIVE mg/dL
NITRITE: NEGATIVE
Protein, ur: NEGATIVE mg/dL
SPECIFIC GRAVITY, URINE: 1.01 (ref 1.005–1.030)
pH: 7 (ref 5.0–8.0)

## 2018-03-08 LAB — PREGNANCY, URINE: PREG TEST UR: NEGATIVE

## 2018-03-08 MED ORDER — NITROFURANTOIN MONOHYD MACRO 100 MG PO CAPS: 100.0000 mg | ORAL_CAPSULE | Freq: Two times a day (BID) | ORAL | 0 refills | Status: DC

## 2018-03-08 NOTE — ED Triage Notes (Signed)
Pt c/o urinary frequency with burning x's 2 weeks,.  Pt denies any abd pain or vag. discharge

## 2018-03-08 NOTE — ED Provider Notes (Signed)
[facility] EMERGENCY DEPARTMENT Provider Note   CSN: [id number] Arrival date & time: 03/08/18  1522     History   Chief Complaint Chief Complaint  Patient presents with  . Urinary Frequency    HPI Erica Hahn is a 27 y.o. female with past medical history of scoliosis who presents today for evaluation of dysuria, increased frequency and urgency for 1 week.  She says that in the past she has had a urinary tract infection however that improved with increase p.o. fluids.  She denies any back pain.  No vaginal discharge or bleeding.  She is not currently menstruating.  No fevers or chills, reports that she does not have any other concerns and otherwise feels well.  HPI  Past Medical History:  Diagnosis Date  . PID (acute pelvic inflammatory disease)   . Scoliosis     Patient Active Problem List   Diagnosis Date Noted  . S/P emergency C-section 07/29/2016    Past Surgical History:  Procedure Laterality Date  . BACK SURGERY       OB History    Gravida  2   Para  1   Term  1   Preterm      AB  0   Living  1     SAB      TAB  0   Ectopic      Multiple      Live Births  1        Obstetric Comments  Has rods in spine and had to have spinal for delivery last time.           Home Medications    Prior to Admission medications   Medication Sig Start Date End Date Taking? Authorizing Provider  diphenhydrAMINE (BENADRYL) 25 MG tablet Take 1 tablet (25 mg total) by mouth every 6 (six) hours as needed. Patient not taking: Reported on 08/06/2017 11/25/16   Loletta Specter, PA-C  docusate sodium (COLACE) 100 MG capsule Take 100 mg by mouth 2 (two) times daily.    [provider]  ferrous sulfate 325 (65 FE) MG EC tablet Take 325 mg by mouth 3 (three) times daily with meals.    [provider]  loratadine (CLARITIN) 10 MG tablet Take 1 tablet (10 mg total) by mouth daily. Patient not taking: Reported on 08/06/2017  11/25/16   Loletta Specter, PA-C  medroxyPROGESTERone (DEPO-PROVERA) 150 MG/ML injection Inject 1 mL (150 mg total) into the muscle every 3 (three) months. 08/06/17   Brock Bad, MD  nitrofurantoin, macrocrystal-monohydrate, (MACROBID) 100 MG capsule Take 1 capsule (100 mg total) by mouth 2 (two) times daily. 03/08/18   Cristina Gong, PA-C    Family History Family History  Problem Relation Age of Onset  . Hypertension Mother   . Hypertension Father     Social History Social History   Tobacco Use  . Smoking status: Never Smoker  . Smokeless tobacco: Never Used  Substance Use Topics  . Alcohol use: Yes    Alcohol/week: 1.0 standard drinks    Types: 1 Glasses of wine per week  . Drug use: No     Allergies   Patient has no known allergies.   Review of Systems Review of Systems  Constitutional: Negative for chills, fatigue and fever.  Genitourinary: Positive for dysuria, frequency and urgency. Negative for decreased urine volume, flank pain, menstrual problem, pelvic pain, vaginal bleeding and vaginal pain.  All other systems reviewed  and are negative.    Physical Exam Updated Vital Signs BP 119/76 (BP Location: Right Arm)   Pulse 62   Temp 98.3 F (36.8 C) (Oral)   Resp 16   Ht 5\' 4"  (1.626 m)   Wt 61.2 kg   SpO2 100%   BMI 23.17 kg/m   Physical Exam  Constitutional: She appears well-developed and well-nourished. No distress.  HENT:  Head: Normocephalic.  Cardiovascular: Normal rate and regular rhythm.  Abdominal: Soft. She exhibits no distension. There is no tenderness.  No CVA tenderness to percussion bilaterally.  Neurological: She is alert.  Skin: Skin is warm and dry. She is not diaphoretic.  Psychiatric: She has a normal mood and affect.  Nursing note and vitals reviewed.    ED Treatments / Results  Labs (all labs ordered are listed, but only abnormal results are displayed) Labs Reviewed  URINALYSIS, ROUTINE W REFLEX MICROSCOPIC -  Abnormal; Notable for the following components:      Result Value   Color, Urine STRAW (*)    Hgb urine dipstick SMALL (*)    Leukocytes, UA LARGE (*)    WBC, UA >50 (*)    Bacteria, UA FEW (*)    All other components within normal limits  URINE CULTURE  PREGNANCY, URINE    EKG None  Radiology No results found.  Procedures Procedures (including critical care time)  Medications Ordered in ED Medications - No data to display   Initial Impression / Assessment and Plan / ED Course  I have reviewed the triage vital signs and the nursing notes.  Pertinent labs & imaging results that were available during my care of the patient were reviewed by me and considered in my medical decision making (see chart for details).  Clinical Course as of Mar 08 1848  Sheral Flow Mar 08, 2018  4098 Listed is completed in the system, however upon speaking with many lab they have not run on.  Will call main lab and ask them to add on  POCT Pregnancy, Urine [EH]  1810 Spoke with main lab who will add on urine pregnant.   [EH]    Clinical Course User Index [EH] Cristina Gong, PA-C   Pt has been diagnosed with a UTI. Pt is afebrile, no CVA tenderness, normotensive, and denies N/V. Pt to be dc home with antibiotics and instructions to follow up with PCP if symptoms persist.   Final Clinical Impressions(s) / ED Diagnoses   Final diagnoses:  Acute cystitis with hematuria    ED Discharge Orders         Ordered    nitrofurantoin, macrocrystal-monohydrate, (MACROBID) 100 MG capsule  2 times daily     03/08/18 1846           Cristina Gong, Cordelia Poche 03/08/18 1849    Wynetta Fines, MD 03/12/18 1511

## 2018-03-08 NOTE — ED Provider Notes (Signed)
Patient placed in Quick Look pathway, seen and evaluated   Chief Complaint: UTI symptoms  HPI: Erica Hahn is a 27 y.o. female who present to the ED with urinary frequency, dysuria and urgency. The symptoms started a week ago and have gotten worse.   ROS: GU: frequency, urgency, dysuria  Physical Exam:  BP 119/76 (BP Location: Right Arm)   Pulse 62   Temp 98.3 F (36.8 C) (Oral)   Resp 16   Ht 5\' 4"  (1.626 m)   Wt 61.2 kg   SpO2 100%   BMI 23.17 kg/m    Gen: No distress  Neuro: Awake and Alert  Skin: Warm and dry  Abdomen: soft, suprapubic tenderness.    Initiation of care has begun. The patient has been counseled on the process, plan, and necessity for staying for the completion/evaluation, and the remainder of the medical screening examination    Janne Napoleon, NP 03/08/18 1557    Loren Racer, MD 03/10/18 1606

## 2018-03-08 NOTE — Discharge Instructions (Addendum)
You may have diarrhea from the antibiotics.  It is very important that you continue to take the antibiotics even if you get diarrhea unless a medical professional tells you that you may stop taking them.  If you stop too early the bacteria you are being treated for will become stronger and you may need different, more powerful antibiotics that have more side effects and worsening diarrhea.  Please stay well hydrated and consider probiotics as they may decrease the severity of your diarrhea.  Please be aware that if you take any hormonal contraception (birth control pills, nexplanon, the ring, etc) that your birth control will not work while you are taking antibiotics and you need to use back up protection as directed on the birth control medication information insert.   You can get a medication called Azo over-the-counter at the drugstore.  This will help with the discomfort with urination.  It will also turn your urine bright orange and will stay in any fabric it comes in contact with.

## 2018-03-10 LAB — URINE CULTURE: Culture: >=100000 — AB

## 2018-03-11 ENCOUNTER — Telehealth: Payer: Self-pay | Admitting: *Deleted

## 2018-03-11 NOTE — Telephone Encounter (Signed)
Post ED Visit - Positive Culture Follow-up  Culture report reviewed by antimicrobial stewardship pharmacist:  []  Enzo Bi, Pharm.D. []  Celedonio Miyamoto, Pharm.D., BCPS AQ-ID []  Garvin Fila, Pharm.D., BCPS []  Georgina Pillion, Pharm.D., BCPS []  [place], [address].D., BCPS, AAHIVP []  Estella Husk, Pharm.D., BCPS, AAHIVP []  Lysle Pearl, PharmD, BCPS []  Phillips Climes, PharmD, BCPS []  Agapito Games, PharmD, BCPS []  Verlan Friends, PharmD J. Tessin, PharmD  Positive urine culture Treated with Nitrofurantoin Monohyd Macro, organism sensitive to the same and no further patient follow-up is required at this time.  Virl Axe [facility] 03/11/2018, 9:13 AM

## 2018-03-18 ENCOUNTER — Ambulatory Visit (INDEPENDENT_AMBULATORY_CARE_PROVIDER_SITE_OTHER): Payer: Medicaid Other | Admitting: Physician Assistant

## 2018-04-29 ENCOUNTER — Ambulatory Visit: Payer: Medicaid Other

## 2018-05-11 ENCOUNTER — Ambulatory Visit (INDEPENDENT_AMBULATORY_CARE_PROVIDER_SITE_OTHER): Payer: Medicaid Other

## 2018-05-11 VITALS — BP 120/77 | HR 85 | Resp 16 | Ht 64.0 in | Wt 149.4 lb

## 2018-05-11 DIAGNOSIS — Z3042 Encounter for surveillance of injectable contraceptive: Secondary | ICD-10-CM | POA: Diagnosis not present

## 2018-05-11 MED ORDER — MEDROXYPROGESTERONE ACETATE 150 MG/ML IM SUSP
150.0000 mg | Freq: Once | INTRAMUSCULAR | Status: AC
  Administered 2018-05-11: 150 mg via INTRAMUSCULAR

## 2018-05-11 NOTE — Progress Notes (Signed)
After obtaining consent, and per orders of Dr. Coral Ceoharles Harper, injection of Depo Provera was administered by Riley NearingMichelle Byrd Leili Eskenazi, CMA. Last injection: 02/11/18 - Left Deltoid. Window: 04/29/18 - 05/13/2018. Patient reports no complaints since last injection. Patient tolerated injection - right deltoid well with no complaints. Patient advised to schedule next Depo Provera injection between 03/25 - 08/11/2018. Patient instructed to remain in clinic for 20 minutes afterwards, and to report any adverse reaction to me immediately.

## 2018-07-28 ENCOUNTER — Ambulatory Visit: Payer: Medicaid Other

## 2018-08-03 ENCOUNTER — Ambulatory Visit (INDEPENDENT_AMBULATORY_CARE_PROVIDER_SITE_OTHER): Payer: Medicaid Other | Admitting: Primary Care

## 2018-08-05 ENCOUNTER — Ambulatory Visit (INDEPENDENT_AMBULATORY_CARE_PROVIDER_SITE_OTHER): Payer: Medicaid Other

## 2018-08-05 ENCOUNTER — Other Ambulatory Visit: Payer: Self-pay

## 2018-08-05 DIAGNOSIS — Z3042 Encounter for surveillance of injectable contraceptive: Secondary | ICD-10-CM

## 2018-08-05 NOTE — Progress Notes (Signed)
I have reviewed this chart and agree with the RN/CMA assessment and management.    K. Meryl Karley Pho, M.D. Attending [facility] (Faculty Practice)   

## 2018-08-05 NOTE — Progress Notes (Signed)
Pt is in the office for depo, administered in L Del, and pt tolerated well. Next due 6/18-7/2. .. Administrations This Visit    medroxyPROGESTERone (DEPO-PROVERA) injection 150 mg    Admin Date 08/05/2018 Action Given Dose 150 mg Route Intramuscular Administered By Katrina Stack, RN

## 2018-10-06 ENCOUNTER — Ambulatory Visit (INDEPENDENT_AMBULATORY_CARE_PROVIDER_SITE_OTHER): Payer: Medicaid Other | Admitting: Primary Care

## 2018-10-11 ENCOUNTER — Encounter (INDEPENDENT_AMBULATORY_CARE_PROVIDER_SITE_OTHER): Payer: Self-pay | Admitting: Primary Care

## 2018-10-11 ENCOUNTER — Other Ambulatory Visit: Payer: Self-pay

## 2018-10-11 ENCOUNTER — Ambulatory Visit (INDEPENDENT_AMBULATORY_CARE_PROVIDER_SITE_OTHER): Payer: Medicaid Other | Admitting: Primary Care

## 2018-10-11 VITALS — BP 106/57 | HR 70 | Temp 98.4°F | Ht 64.0 in | Wt 155.4 lb

## 2018-10-11 DIAGNOSIS — G44001 Cluster headache syndrome, unspecified, intractable: Secondary | ICD-10-CM

## 2018-10-11 DIAGNOSIS — M549 Dorsalgia, unspecified: Secondary | ICD-10-CM

## 2018-10-11 DIAGNOSIS — G8929 Other chronic pain: Secondary | ICD-10-CM | POA: Diagnosis not present

## 2018-10-11 DIAGNOSIS — F439 Reaction to severe stress, unspecified: Secondary | ICD-10-CM | POA: Diagnosis not present

## 2018-10-11 MED ORDER — IBUPROFEN 800 MG PO TABS: 600.0000 mg | ORAL_TABLET | Freq: Three times a day (TID) | ORAL | 1 refills | Status: AC | PRN

## 2018-10-11 NOTE — Patient Instructions (Signed)

## 2018-10-11 NOTE — Progress Notes (Signed)
Established Patient Office Visit  Subjective:  Patient ID: Erica Hahn, female    DOB: 1991/02/02  Age: 28 y.o. MRN: [id number]  CC:  Chief Complaint  Patient presents with  . Back Pain  . Headache    HPI Erica Hahn presents for back pain that has been increasing in the last few months and she has also notices having headaches at least 2-3 times a week. Past medical history of scoliosis. She presents with back pain 5/10.  Past Medical History:  Diagnosis Date  . PID (acute pelvic inflammatory disease)   . Scoliosis     Past Surgical History:  Procedure Laterality Date  . BACK SURGERY      Family History  Problem Relation Age of Onset  . Hypertension Mother   . Hypertension Father     Social History   Socioeconomic History  . Marital status: Single    Spouse name: Not on file  . Number of children: Not on file  . Years of education: Not on file  . Highest education level: Not on file  Occupational History  . Not on file  Social Needs  . Financial resource strain: Not on file  . Food insecurity    Worry: Not on file    Inability: Not on file  . Transportation needs    Medical: Not on file    Non-medical: Not on file  Tobacco Use  . Smoking status: Never Smoker  . Smokeless tobacco: Never Used  Substance and Sexual Activity  . Alcohol use: Yes    Alcohol/week: 1.0 standard drinks    Types: 1 Glasses of wine per week  . Drug use: No  . Sexual activity: Yes    Partners: Male    Birth control/protection: Injection  Lifestyle  . Physical activity    Days per week: Not on file    Minutes per session: Not on file  . Stress: Not on file  Relationships  . Social Herbalist on phone: Not on file    Gets together: Not on file    Attends religious service: Not on file    Active member of club or organization: Not on file    Attends meetings of clubs or organizations: Not on file    Relationship status: Not on file  . Intimate  partner violence    Fear of current or ex partner: Not on file    Emotionally abused: Not on file    Physically abused: Not on file    Forced sexual activity: Not on file  Other Topics Concern  . Not on file  Social History Narrative  . Not on file    Outpatient Medications Prior to Visit  Medication Sig Dispense Refill  . medroxyPROGESTERone (DEPO-PROVERA) 150 MG/ML injection Inject 1 mL (150 mg total) into the muscle every 3 (three) months. 1 mL 4   Facility-Administered Medications Prior to Visit  Medication Dose Route Frequency Provider Last Rate Last Dose  . medroxyPROGESTERone (DEPO-PROVERA) injection 150 mg  150 mg Intramuscular Q90 days Chancy Milroy, MD   150 mg at 08/05/18 1328  . medroxyPROGESTERone (DEPO-PROVERA) injection 150 mg  150 mg Intramuscular Once Chancy Milroy, MD        No Known Allergies  ROS Review of Systems  Constitutional: Negative.   HENT: Negative.   Eyes: Negative.   Respiratory: Negative.   Gastrointestinal: Negative.   Endocrine: Negative.   Genitourinary: Negative.   Musculoskeletal: Positive for  back pain.  Skin: Negative.   Allergic/Immunologic: Negative.   Neurological: Positive for headaches.  Hematological: Negative.   Psychiatric/Behavioral: Negative.       Objective:    Physical Exam  Constitutional: She is oriented to person, place, and time. She appears well-developed and well-nourished.  HENT:  Head: Normocephalic and atraumatic.  Eyes: EOM are normal.  Neck: Normal range of motion.  Cardiovascular: Normal rate and regular rhythm.  Pulmonary/Chest: Effort normal and breath sounds normal.  Abdominal: Soft. Bowel sounds are normal.  Musculoskeletal:        General: Deformity present.  Neurological: She is alert and oriented to person, place, and time.  Skin: Skin is warm.  Psychiatric: She has a normal mood and affect.    BP (!) 106/57 (BP Location: Left Arm, Patient Position: Sitting, Cuff Size: Normal)    Pulse 70   Temp 98.4 F (36.9 C) (Oral)   Ht 5\' 4"  (1.626 m)   Wt 155 lb 6.4 oz (70.5 kg)   LMP 10/03/2018 (Exact Date)   SpO2 99%   Breastfeeding No   BMI 26.67 kg/m  Wt Readings from Last 3 Encounters:  10/11/18 155 lb 6.4 oz (70.5 kg)  05/11/18 149 lb 6.4 oz (67.8 kg)  03/08/18 135 lb (61.2 kg)     Health Maintenance Due  Topic Date Due  . TETANUS/TDAP  01/21/2012    There are no preventive care reminders to display for this patient.  Lab Results  Component Value Date   TSH 0.818 01/11/2016   Lab Results  Component Value Date   WBC 6.5 11/25/2016   HGB 11.7 11/25/2016   HCT 36.0 11/25/2016   MCV 87 11/25/2016   PLT 282 11/25/2016   Lab Results  Component Value Date   NA 141 11/25/2016   K 3.7 11/25/2016   CO2 21 11/25/2016   GLUCOSE 77 11/25/2016   BUN 12 11/25/2016   CREATININE 0.89 11/25/2016   BILITOT 0.7 11/25/2016   ALKPHOS 86 11/25/2016   AST 12 11/25/2016   ALT 9 11/25/2016   PROT 7.4 11/25/2016   ALBUMIN 4.5 11/25/2016   CALCIUM 9.7 11/25/2016   ANIONGAP 11 09/07/2015   No results found for: CHOL No results found for: HDL No results found for: LDLCALC No results found for: TRIG No results found for: CHOLHDL No results found for: [id number]    Assessment & Plan:   Problem List Items Addressed This Visit    None    Visit Diagnoses    Chronic bilateral back pain, unspecified back location    -  Primary   Relevant Medications   ibuprofen (ADVIL) 800 MG tablet   Other Relevant Orders   Ambulatory referral to Orthopedic Surgery   Stress       Intractable cluster headache syndrome, unspecified chronicity pattern       Relevant Medications   ibuprofen (ADVIL) 800 MG tablet    Erica Hahn was seen today for back pain and headache.  Diagnoses and all orders for this visit:  Chronic bilateral back pain, unspecified back location Chronic she does have a medical history of scoliosis this also can be a contributing factor to her back pain. -      Ambulatory referral to Orthopedic Surgery  Stress Situational pandemic unsure what the next report will be. Participating in social distance and a usage of a mask. Advise to find something relaxing ie read a book, take up meditation, adult coloring. This is also in combination of anxiety if  not improved will consider Buspar.   Intractable cluster headache syndrome, unspecified chronicity pattern No aura , precipitating symptoms , n/v, photosensitively. Encouraged to keep a head ache log to determine if they are in association with a food, situation or activity   Other orders -     ibuprofen (ADVIL) 800 MG tablet; Take 1 tablet (800 mg total) by mouth every 8 (eight) hours as needed for up to 30 days for headache or moderate pain (back).    Meds ordered this encounter  Medications  . ibuprofen (ADVIL) 800 MG tablet    Sig: Take 1 tablet (800 mg total) by mouth every 8 (eight) hours as needed for up to 30 days for headache or moderate pain (back).    Dispense:  60 tablet    Refill:  1    Follow-up: Return in about 3 months (around 01/11/2019) for back pain , headaches.    Grayce SessionsMichelle P Hiep Ollis, NP

## 2018-10-26 ENCOUNTER — Ambulatory Visit: Payer: Medicaid Other

## 2018-10-27 ENCOUNTER — Other Ambulatory Visit: Payer: Self-pay | Admitting: Obstetrics

## 2018-10-27 ENCOUNTER — Ambulatory Visit: Payer: Medicaid Other

## 2018-10-27 DIAGNOSIS — Z30013 Encounter for initial prescription of injectable contraceptive: Secondary | ICD-10-CM

## 2018-11-02 ENCOUNTER — Ambulatory Visit (INDEPENDENT_AMBULATORY_CARE_PROVIDER_SITE_OTHER): Payer: Medicaid Other

## 2018-11-02 ENCOUNTER — Encounter: Payer: Self-pay | Admitting: Orthopaedic Surgery

## 2018-11-02 ENCOUNTER — Ambulatory Visit (INDEPENDENT_AMBULATORY_CARE_PROVIDER_SITE_OTHER): Payer: Medicaid Other | Admitting: Orthopaedic Surgery

## 2018-11-02 ENCOUNTER — Other Ambulatory Visit: Payer: Self-pay

## 2018-11-02 VITALS — Ht 64.0 in | Wt 155.0 lb

## 2018-11-02 DIAGNOSIS — Z8739 Personal history of other diseases of the musculoskeletal system and connective tissue: Secondary | ICD-10-CM | POA: Diagnosis not present

## 2018-11-02 DIAGNOSIS — Z981 Arthrodesis status: Secondary | ICD-10-CM | POA: Diagnosis not present

## 2018-11-02 DIAGNOSIS — M545 Low back pain, unspecified: Secondary | ICD-10-CM

## 2018-11-02 NOTE — Progress Notes (Signed)
Office Visit Note   Patient: Erica Hahn           Date of Birth: 1990/06/25           MRN: [id number] Visit Date: 11/02/2018              Requested by: Kerin Perna, NP [address] [place],  [place] [address] PCP: Kerin Perna, NP   Assessment & Plan: Visit Diagnoses:  1. Low back pain, unspecified back pain laterality, unspecified chronicity, unspecified whether sciatica present   2. History of fusion of spine for scoliosis     Plan: We will order some physical therapy for evaluation treatment core strengthening.  With the childbirth she is put on some weight and would like to get back on exercise regimen and lose 10 to 15 pounds which should help her lower back.  We will start some physical therapy and I will recheck her in 2 months.  Follow-Up Instructions: Return in about 2 months (around 01/02/2019).   Orders:  Orders Placed This Encounter  Procedures  . XR Lumbar Spine 2-3 Views   No orders of the defined types were placed in this encounter.     Procedures: No procedures performed   Clinical Data: No additional findings.   Subjective: Chief Complaint  Patient presents with  . Lower Back - Pain    HPI 28 year old female had scoliosis surgery at [facility] when she was about age 89 for a approximately 46 degree curve and was fused likely by Dr. Larey Dresser from [place].  She has been having some rib pain posteriorly on the left at about T9-T12 at times also some discomfort in the lumbar spine.  She has a lumbar curve below her solid fusion.  She has 41-year-old and a 68-year-old sons she has been using some ibuprofen and Tylenol without relief.  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  She denies any associated bowel bladder symptoms.  No numbness or tingling in her legs.  At times she is occasionally had discomfort into her left buttocks.  Review of Systems positive for C-section with childbirth.  Scoliosis surgery as listed  above.  Otherwise -14 point systems as it pertains HPI.   Objective: Vital Signs: Ht 5\' 4"  (1.626 m)   Wt 155 lb (70.3 kg)   LMP 10/03/2018 (Exact Date)   BMI 26.61 kg/m   Physical Exam Constitutional:      Appearance: She is well-developed.  HENT:     Head: Normocephalic.     Right Ear: External ear normal.     Left Ear: External ear normal.  Eyes:     Pupils: Pupils are equal, round, and reactive to light.  Neck:     Thyroid: No thyromegaly.     Trachea: No tracheal deviation.  Cardiovascular:     Rate and Rhythm: Normal rate.  Pulmonary:     Effort: Pulmonary effort is normal.  Abdominal:     Palpations: Abdomen is soft.  Skin:    General: Skin is warm and dry.  Neurological:     Mental Status: She is alert and oriented to person, place, and time.  Psychiatric:        Behavior: Behavior normal.     Ortho Exam patient has 1+ knee and ankle jerks she is able heel and toe walk.  Good forward  bending lumbar spine mild left-sided notch tenderness minimal trochanteric bursal tenderness negative logroll of the hips anterior  tib gastrocsoleus quads hip flexors are strong.  Specialty Comments:  No specialty comments available.  Imaging: Xr Lumbar Spine 2-3 Views  Result Date: 11/02/2018 4 view x-rays thoracic lumbar spine obtained and reviewed.  This shows instrumented thoracic fusion with multiple level pedicle screws fusion from T4-L2.  There is lumbar curve below the fusion.  Hips and pelvis are normal.  Upper hooks appear well positioned.  Fusion noted laterally on the right side.  No evidence of hardware failure. Previous thoracolumbar fusion as noted above.  Scoliosis noted below the level of the fusion between L2 and S1.    PMFS History: Patient Active Problem List   Diagnosis Date Noted  . History of fusion of spine for scoliosis 11/02/2018  . S/P emergency C-section 07/29/2016   Past Medical History:  Diagnosis Date  . PID (acute pelvic inflammatory  disease)   . Scoliosis     Family History  Problem Relation Age of Onset  . Hypertension Mother   . Hypertension Father     Past Surgical History:  Procedure Laterality Date  . BACK SURGERY     Social History   Occupational History  . Not on file  Tobacco Use  . Smoking status: Never Smoker  . Smokeless tobacco: Never Used  Substance and Sexual Activity  . Alcohol use: Yes    Alcohol/week: 1.0 standard drinks    Types: 1 Glasses of wine per week  . Drug use: No  . Sexual activity: Yes    Partners: Male    Birth control/protection: Injection

## 2018-11-04 ENCOUNTER — Ambulatory Visit: Payer: Medicaid Other

## 2018-11-04 ENCOUNTER — Other Ambulatory Visit: Payer: Self-pay

## 2018-11-04 ENCOUNTER — Ambulatory Visit (INDEPENDENT_AMBULATORY_CARE_PROVIDER_SITE_OTHER): Payer: Medicaid Other

## 2018-11-04 DIAGNOSIS — Z3042 Encounter for surveillance of injectable contraceptive: Secondary | ICD-10-CM

## 2018-11-04 MED ORDER — MEDROXYPROGESTERONE ACETATE 150 MG/ML IM SUSP
150.0000 mg | Freq: Once | INTRAMUSCULAR | Status: AC
  Administered 2018-11-04: 14:00:00 150 mg via INTRAMUSCULAR

## 2018-11-04 NOTE — Progress Notes (Signed)
Pt is here for depo injection. Pt is on time, injection given in R arm without difficulty. Next depo due 9/17-10/1.

## 2018-11-04 NOTE — Progress Notes (Signed)
I have reviewed this chart and agree with the RN/CMA assessment and management.    K. Meryl Nakia Remmers, M.D. Attending [facility] (Faculty Practice)   

## 2018-11-04 NOTE — Addendum Note (Signed)
Addended by: Meyer Cory on: 11/04/2018 04:36 PM   Modules accepted: Orders

## 2018-11-24 ENCOUNTER — Ambulatory Visit: Payer: Medicaid Other | Admitting: Obstetrics

## 2019-01-04 ENCOUNTER — Ambulatory Visit: Payer: Medicaid Other | Admitting: Orthopaedic Surgery

## 2019-01-12 ENCOUNTER — Encounter (INDEPENDENT_AMBULATORY_CARE_PROVIDER_SITE_OTHER): Payer: Self-pay | Admitting: Primary Care

## 2019-01-12 ENCOUNTER — Telehealth (INDEPENDENT_AMBULATORY_CARE_PROVIDER_SITE_OTHER): Payer: Medicaid Other | Admitting: Primary Care

## 2019-01-12 ENCOUNTER — Other Ambulatory Visit: Payer: Self-pay

## 2019-01-12 DIAGNOSIS — G43109 Migraine with aura, not intractable, without status migrainosus: Secondary | ICD-10-CM

## 2019-01-12 DIAGNOSIS — Z7689 Persons encountering health services in other specified circumstances: Secondary | ICD-10-CM

## 2019-01-12 DIAGNOSIS — G4700 Insomnia, unspecified: Secondary | ICD-10-CM | POA: Diagnosis not present

## 2019-01-12 DIAGNOSIS — J302 Other seasonal allergic rhinitis: Secondary | ICD-10-CM

## 2019-01-12 DIAGNOSIS — F439 Reaction to severe stress, unspecified: Secondary | ICD-10-CM

## 2019-01-12 MED ORDER — SUMATRIPTAN SUCCINATE 50 MG PO TABS: 50.0000 mg | ORAL_TABLET | ORAL | 1 refills | Status: DC | PRN

## 2019-01-12 MED ORDER — MELATONIN 5 MG PO TABS: 5.0000 mg | ORAL_TABLET | ORAL | 3 refills | Status: DC | PRN

## 2019-01-12 MED ORDER — LORATADINE 10 MG PO TABS: 10.0000 mg | ORAL_TABLET | Freq: Every day | ORAL | 11 refills | Status: DC

## 2019-01-12 MED ORDER — IBUPROFEN 800 MG PO TABS: 600.0000 mg | ORAL_TABLET | Freq: Three times a day (TID) | ORAL | 1 refills | Status: DC | PRN

## 2019-01-12 NOTE — Progress Notes (Signed)
Virtual Visit via Telephone Note  I connected with Erica Hahn on 01/12/19 at  9:50 AM EDT by telephone and verified that I am speaking with the correct person using two identifiers.   I discussed the limitations, risks, security and privacy concerns of performing an evaluation and management service by telephone and the availability of in person appointments. I also discussed with the patient that there may be a patient responsible charge related to this service. The patient expressed understanding and agreed to proceed.   History of Present Illness: Erica Hahn is having a web visit for headache. She has intermittent she can go weeks without headaches than she has 3-4 headaches and endorses photosensitivity and sound can trigger headaches.   Past Medical History:  Diagnosis Date  . PID (acute pelvic inflammatory disease)   . Scoliosis    Observations/Objective: Review of Systems  Eyes: Positive for photophobia.  Neurological: Positive for dizziness and headaches.  Psychiatric/Behavioral: The patient has insomnia.     Assessment and Plan: Erica Hahn was seen today for follow-up.  Diagnoses and all orders for this visit:  Establishing care with a new doctor encounter Erica Hahn has been followed by OBGYN for contraception management , establishing care with a PCP for health maintenance and chronic disease.  Migraine with aura and without status migrainosus, not intractable Signs can be individualized with throbbing pain on any area of the head.  They may come with an aura-dizziness, nausea, sensitive to light sound or smell.  Triggers can be caused by drinking alcohol smoking or certain medications, eating and drinking certain products  This condition may be triggered or caused by: Caffeine, aged cheese, and chocolate.  Insomnia, unspecified type Can try melatonin 5mg -15 mg at night for sleep, can also do benadryl 25-50mg  at night for sleep.  If this does not help  we can try prescription medication.  Also here is some information about good sleep hygiene.   Insomnia Insomnia is frequent trouble falling and/or staying asleep. Insomnia can be a long term problem or a short term problem. Both are common. Insomnia can be a short term problem when the wakefulness is related to a certain stress or worry. Long term insomnia is often related to ongoing stress during waking hours and/or poor sleeping habits. Overtime, sleep deprivation itself can make the problem worse. Every little thing feels more severe because you are overtired and your ability to cope is decreased. CAUSES   Stress, anxiety, and depression.  Poor sleeping habits.  Distractions such as TV in the bedroom.  Naps close to bedtime.  Engaging in emotionally charged conversations before bed.  Technical reading before sleep.  Alcohol and other sedatives. They may make the problem worse. They can hurt normal sleep patterns and normal dream activity.  Stimulants such as caffeine for several hours prior to bedtime.  Pain syndromes and shortness of breath can cause insomnia.  Exercise late at night.  Changing time zones may cause sleeping problems (jet lag). It is sometimes helpful to have someone observe your sleeping patterns. They should look for periods of not breathing during the night (sleep apnea). They should also look to see how long those periods last. If you live alone or observers are uncertain, you can also be observed at a sleep clinic where your sleep patterns will be professionally monitored. Sleep apnea requires a checkup and treatment. Give your caregivers your medical history. Give your caregivers observations your family has made about your sleep.  SYMPTOMS  Not feeling rested in the morning.  Anxiety and restlessness at bedtime.  Difficulty falling and staying asleep. TREATMENT   Your caregiver may prescribe treatment for an underlying medical disorders. Your  caregiver can give advice or help if you are using alcohol or other drugs for self-medication. Treatment of underlying problems will usually eliminate insomnia problems.  Medications can be prescribed for short time use. They are generally not recommended for lengthy use.  Over-the-counter sleep medicines are not recommended for lengthy use. They can be habit forming.  You can promote easier sleeping by making lifestyle changes such as:  Using relaxation techniques that help with breathing and reduce muscle tension.  Exercising earlier in the day.  Changing your diet and the time of your last meal. No night time snacks.  Establish a regular time to go to bed.  Counseling can help with stressful problems and worry.  Soothing music and white noise may be helpful if there are background noises you cannot remove.  Stop tedious detailed work at least one hour before bedtime. HOME CARE INSTRUCTIONS   Keep a diary. Inform your caregiver about your progress. This includes any medication side effects. See your caregiver regularly. Take note of:  Times when you are asleep.  Times when you are awake during the night.  The quality of your sleep.  How you feel the next day. This information will help your caregiver care for you.  Get out of bed if you are still awake after 15 minutes. Read or do some quiet activity. Keep the lights down. Wait until you feel sleepy and go back to bed.  Keep regular sleeping and waking hours. Avoid naps.  Exercise regularly.  Avoid distractions at bedtime. Distractions include watching television or engaging in any intense or detailed activity like attempting to balance the household checkbook.  Develop a bedtime ritual. Keep a familiar routine of bathing, brushing your teeth, climbing into bed at the same time each night, listening to soothing music. Routines increase the success of falling to sleep faster.  Use relaxation techniques. This can be using  breathing and muscle tension release routines. It can also include visualizing peaceful scenes. You can also help control troubling or intruding thoughts by keeping your mind occupied with boring or repetitive thoughts like the old concept of counting sheep. You can make it more creative like imagining planting one beautiful flower after another in your backyard garden.  During your day, work to eliminate stress. When this is not possible use some of the previous suggestions to help reduce the anxiety that accompanies stressful situations. MAKE SURE YOU:   Understand these instructions.  Will watch your condition.  Will get help right away if you are not doing well or get worse. Document Released: 04/18/2000 Document Revised: 07/14/2011 Document Reviewed: 05/19/2007 [facility] Patient Information 2015 [place], [place]. This information is not intended to replace advice given to you by your health care provider. Make sure you discuss any questions you have with your health care provider.  Stress  We discussed options for treatment of anxiety including therapy and/or medication.  Will check basic labs to ensure thyroid is in normal range and that no other metabolic issues are obvious.  Reviewed concept of anxiety as biochemical imbalance of neurotransmitters and rationale for treatment. Discussed potential risks, expected benefits, possible side effects of the medicine. We also discussed how to take it correctly and dosing instructions. If she has any significant side effects to the medicine, she is to stop  it and call for advice.  Instructed patient to contact office or on-call physician promptly should condition worsen or any new symptoms appear.    She was agreeable with this plan.    Seasonal allergic rhinitis, unspecified trigger  Use Flonase nasal spray for at least duration of your allergy season.  - For appropriate administration of the nasal spray, clear the nose, use opposite hand  for opposite nare, sniff gently, exhale through your mouth. - For maximal effect take these two nasal sprays at least 30 minutes apart. Sent in [place]- Drink at least 64 ounces of water each day. - If you have a humidifier use it nightly. - Remove as many irritants/allergies as you are able to, no pets in the bedroom, change air filters in air vents.   Other orders -     SUMAtriptan (IMITREX) 50 MG tablet; Take 1 tablet (50 mg total) by mouth every 2 (two) hours as needed for migraine. May repeat in 2 hours if headache persists or recurs. -     ibuprofen (ADVIL) 800 MG tablet; Take 1 tablet (800 mg total) by mouth every 8 (eight) hours as needed for headache. -     loratadine (CLARITIN) 10 MG tablet; Take 1 tablet (10 mg total) by mouth daily. -     Melatonin 5 MG TABS; Take 1 tablet (5 mg total) by mouth continuous as needed.    Follow Up Instructions:    I discussed the assessment and treatment plan with the patient. The patient was provided an opportunity to ask questions and all were answered. The patient agreed with the plan and demonstrated an understanding of the instructions.   The patient was advised to call back or seek an in-person evaluation if the symptoms worsen or if the condition fails to improve as anticipated.  I provided 28 minutes of non-face-to-face time during this encounter.   Grayce Sessions, NP

## 2019-01-26 ENCOUNTER — Ambulatory Visit: Payer: Medicaid Other

## 2019-02-04 ENCOUNTER — Ambulatory Visit: Payer: Medicaid Other

## 2019-02-08 ENCOUNTER — Encounter (INDEPENDENT_AMBULATORY_CARE_PROVIDER_SITE_OTHER): Payer: Self-pay | Admitting: Primary Care

## 2019-02-09 ENCOUNTER — Encounter: Payer: Self-pay | Admitting: Physical Therapy

## 2019-02-09 ENCOUNTER — Ambulatory Visit: Payer: Medicaid Other | Attending: Orthopaedic Surgery | Admitting: Physical Therapy

## 2019-02-09 ENCOUNTER — Other Ambulatory Visit: Payer: Self-pay

## 2019-02-09 DIAGNOSIS — M545 Low back pain: Secondary | ICD-10-CM | POA: Diagnosis not present

## 2019-02-09 DIAGNOSIS — R293 Abnormal posture: Secondary | ICD-10-CM | POA: Insufficient documentation

## 2019-02-09 DIAGNOSIS — G8929 Other chronic pain: Secondary | ICD-10-CM

## 2019-02-09 NOTE — Therapy (Addendum)
[place], [place], [address] Phone: [phone]   Fax:  [phone]  Physical Therapy Evaluation/Discharge  Patient Details  Name: Erica Hahn MRN: [id number] Date of Birth: 1991-02-12 Referring Provider (PT): Marybelle Killings, MD   Encounter Date: 02/09/2019  PT End of Session - 02/09/19 1150    Visit Number  1    Date for PT Re-Evaluation  03/11/19    Authorization Type  M[employer]- Josem Kaufmann submitted 10/7    PT Start Time  1147    PT Stop Time  1220    PT Time Calculation (min)  33 min    Activity Tolerance  Patient tolerated treatment well    Behavior During Therapy  [facility] for tasks assessed/performed       Past Medical History:  Diagnosis Date  . PID (acute pelvic inflammatory disease)   . Scoliosis     Past Surgical History:  Procedure Laterality Date  . BACK SURGERY      There were no vitals filed for this visit.   Subjective Assessment - 02/09/19 1151    Subjective  T4-L2 fusion in 2009. Lt side of rib cage is what hurts mostly. Aches badly, mostly when laying on Rt side- feels like Lt is caving in. 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 Diagnosed with scoliosis in 8th grade and had fusions in 9th grade. A little bit of LBP, not usually neck pain. Denies regular exercise. Has a 26 yr old and 26 yr old.    How long can you sit comfortably?  1 hr    How long can you stand comfortably?  1 hr    Patient Stated Goals  decrease pain, work, pain with bending, sleep on Rt or back, walking    Currently in Pain?  Yes    Pain Score  3     Pain Location  Back   into ribs   Pain Orientation  Left;Mid    Pain Descriptors / Indicators  Sore    Aggravating Factors   sit/walk long periods, laying on Rt/back    Pain Relieving Factors  lay on left         [facility] PT Assessment - 02/09/19 0001      Assessment   Medical Diagnosis  back pain, scoliosis with fusion T4-L2    Referring  Provider (PT)  Marybelle Killings, MD    Onset Date/Surgical Date  --   2008   Hand Dominance  Right      Precautions   Precautions  None      Restrictions   Weight Bearing Restrictions  No      Balance Screen   Has the patient fallen in the past 6 months  Yes   fell off of sons hoverboard   How many times?  1    Has the patient had a decrease in activity level because of a fear of falling?   No    Is the patient reluctant to leave their home because of a fear of falling?   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      Posture/Postural Control   Posture Comments  anterior pelvic tilt, Lt musculature pulling Lt ASIS superiorly, Rt shoulder/scapular elevation      ROM / Strength   AROM / PROM / Strength  Strength      Strength   Strength Assessment Site  Hip    Right/Left Hip  Right;Left    Right Hip ABduction  4/5    Left Hip ABduction  5/5                Objective measurements completed on examination: See above findings.      [facility] Adult PT Treatment/Exercise - 02/09/19 0001      Exercises   Exercises  Lumbar      Lumbar Exercises: Stretches   Quadruped Mid Back Stretch Limitations  child pose      Lumbar Exercises: Standing   Other Standing Lumbar Exercises  Rt SLS on step with Lt swinging      Lumbar Exercises: Supine   AB Set Limitations  engaging transverse abdominis             PT Education - 02/09/19 1247    Education Details  anatomy of condition, POC, HEP, exercise form/rationale    Person(s) Educated  Patient    Methods  Explanation;Demonstration;Tactile cues;Verbal cues;Handout     Comprehension  Verbalized understanding;Returned demonstration;Verbal cues required;Tactile cues required;Need further instruction       PT Short Term Goals - 02/09/19 1243      PT SHORT TERM GOAL #1   Title  Pt will be able to demonstrate proper abdominal activation in all positions    Baseline  began educating at eval    Time  4    Period  Weeks    Status  New    Target Date  03/11/19      PT SHORT TERM GOAL #2   Title  pt will be complaint with daily exercise routine    Baseline  will establish proper routine for short term    Time  4    Period  Weeks    Status  New    Target Date  03/11/19      PT SHORT TERM GOAL #3   Title  pt will demo proper hip hinge without weight    Baseline  will train as appropriate    Time  6    Period  Weeks    Status  New    Target Date  03/11/19        PT Long Term Goals - 02/09/19 1246      PT LONG TERM GOAL #1   Title  to be set at [place] - 02/09/19 1237    Clinical Impression Statement  Pt presents to PT with complaints of Lt-sided rib pain that has been chronic. She was diagnosed with scoliosis in 8th grade which was quickly followed by T4-L2 fusion in 2009. We discussed the the pattern of pull of muscles is not automatically changed by having a spinal fusion so retraining is going to be key. Pt does not exercise regularly so she has little core strength to provide support to spine. Will benefit from skilled PT in order to train proper muscle activation/support in order to decrease pain and avoid future injury.    Personal Factors  and Comorbidities  Comorbidity 1    Comorbidities  scoliosis with fusion    Examination-Activity Limitations  Locomotion Level;Bed Mobility;Reach Overhead;Bend;Caring for Others;Sit;Sleep;Carry;Squat;Dressing;Stairs;Stand;Lift    Examination-Participation Restrictions  Meal Prep;Cleaning;Community Activity;Laundry    Stability/Clinical Decision Making  Evolving/Moderate complexity     Clinical Decision Making  Moderate    Rehab Potential  Good    PT Frequency  --   3 visits in first auth   PT Treatment/Interventions  ADLs/Self Care Home Management;Cryotherapy;Electrical Stimulation;Ultrasound;Moist Heat;Iontophoresis '4mg'$ /ml Dexamethasone;Gait training;Stair training;Functional mobility training;Therapeutic activities;Therapeutic exercise;Patient/family education;Neuromuscular re-education;Manual techniques;Passive range of motion;Dry needling;Taping    PT Next Visit Plan  gross core activation, Rt obliques, begin planks, would like to progress into reformer    PT Home Exercise Plan  abdominal engagement, child pose, Rt leg on step with Lt swinging    Consulted and Agree with Plan of Care  Patient       Patient will benefit from skilled therapeutic intervention in order to improve the following deficits and impairments:  Difficulty walking, Increased muscle spasms, Decreased activity tolerance, Pain, Improper body mechanics, Decreased strength, Postural dysfunction  Visit Diagnosis: Chronic low back pain, unspecified back pain laterality, unspecified whether sciatica present - Plan: PT plan of care cert/re-cert  Abnormal posture - Plan: PT plan of care cert/re-cert     Problem List Patient Active Problem List   Diagnosis Date Noted  . History of fusion of spine for scoliosis 11/02/2018  . S/P emergency C-section 07/29/2016  Gay Filler. Kamy Poinsett PT, DPT 02/09/19 12:52 PM   [facility] [address] [place], [place], [address] Phone: [phone]   Fax:  [phone]  Name: Erica Hahn MRN: [id number] Date of Birth: [birth year]  PHYSICAL THERAPY DISCHARGE SUMMARY  Visits from Start of Care: 1  Current functional level related to goals / functional outcomes: See above   Remaining deficits: See above   Education / Equipment: Anatomy of condition, POC, HEP, exercise form/rationale  Plan: Patient  agrees to discharge.  Patient goals were not met. Patient is being discharged due to the patient's request.  ?????     Aayush Gelpi C. Delayna Sparlin PT, DPT 03/10/19 12:15 PM

## 2019-02-24 ENCOUNTER — Telehealth: Payer: Self-pay | Admitting: Physical Therapy

## 2019-02-24 ENCOUNTER — Ambulatory Visit: Payer: Medicaid Other | Admitting: Physical Therapy

## 2019-02-24 NOTE — Telephone Encounter (Signed)
VM not set up, unable to leave message regarding no show today. Sharyn Brilliant C. Trystin Terhune PT, DPT 02/24/19 11:40 AM

## 2019-02-25 ENCOUNTER — Telehealth: Payer: Self-pay | Admitting: Obstetrics

## 2019-03-03 ENCOUNTER — Ambulatory Visit: Payer: Medicaid Other | Admitting: Physical Therapy

## 2019-03-10 ENCOUNTER — Ambulatory Visit: Payer: Medicaid Other | Attending: Orthopaedic Surgery | Admitting: Physical Therapy

## 2019-03-10 ENCOUNTER — Telehealth: Payer: Self-pay | Admitting: Physical Therapy

## 2019-03-10 NOTE — Telephone Encounter (Signed)
Spoke with patient- she is beginning [place] for son and says it is too much for both of them to have regular appointments right now. Says she is doing okay with HEP and will obtain a new referral should she need to return. Kanaan Kagawa C. Cerenity Goshorn PT, DPT 03/10/19 12:17 PM

## 2019-03-17 ENCOUNTER — Telehealth (INDEPENDENT_AMBULATORY_CARE_PROVIDER_SITE_OTHER): Payer: Medicaid Other | Admitting: Primary Care

## 2019-03-17 DIAGNOSIS — Z708 Other sex counseling: Secondary | ICD-10-CM | POA: Diagnosis not present

## 2019-03-17 DIAGNOSIS — R1084 Generalized abdominal pain: Secondary | ICD-10-CM | POA: Diagnosis not present

## 2019-03-17 DIAGNOSIS — N926 Irregular menstruation, unspecified: Secondary | ICD-10-CM

## 2019-03-17 DIAGNOSIS — R110 Nausea: Secondary | ICD-10-CM | POA: Diagnosis not present

## 2019-03-17 NOTE — Progress Notes (Signed)
Virtual Visit via Telephone Note  I connected with Erica Hahn on 03/17/19 at  2:10 PM EST by telephone and verified that I am speaking with the correct person using two identifiers.   I discussed the limitations, risks, security and privacy concerns of performing an evaluation and management service by telephone and the availability of in person appointments. I also discussed with the patient that there may be a patient responsible charge related to this service. The patient expressed understanding and agreed to proceed.   History of Present Illness: Erica Hahn is having a web visit with complaints abdominal discomfort/pressure, last menstrual cycle unknown irregular due to depo for birth controlled. Stopped her depo was due the first week of October. Last week 03/11/2019 increase light headed, nausea and bloating. Sexually active using no birth controlled.   Past Medical History:  Diagnosis Date  . PID (acute pelvic inflammatory disease)   . Scoliosis    Observations/Objective: Review of Systems  HENT: Positive for ear discharge.   Gastrointestinal: Positive for abdominal pain, heartburn and nausea.  All other systems reviewed and are negative.  Assessment and Plan: Diagnoses and all orders for this visit:  Generalized abdominal pain Stopped her depo for birth control last month and is not practicing safe sex. She feels like she is not pregnant. Advised her to get a pregnancy test. This is probably related to declining hormones and body preparing for ovulation.   Nausea without vomiting Denies related to food or smell feels this is related to her bloating. She has 1 bowel movement a day encourage to drink 64 oz of water a day increase fruits and vegetables. No actual vomiting if she does have emesis clear liquids , applesauce and crackers.  Irregular menstrual cycle Amenorrhea secondary to depo medrol 150 mg every 3 months. Stopped taking to determine if the medication  was the cause of her head aches. Verdict still out.  Sexually transmitted disease counseling Pt counseled regarding condom use with each sexual activity to promote wellness and prevention of transmission of HIV, syphilis, herpes simplex virus, gonorrhea, chlamydia and trichomoniasis.  Follow Up Instructions:    I discussed the assessment and treatment plan with the patient. The patient was provided an opportunity to ask questions and all were answered. The patient agreed with the plan and demonstrated an understanding of the instructions.   The patient was advised to call back or seek an in-person evaluation if the symptoms worsen or if the condition fails to improve as anticipated.  I provided 22 minutes of non-face-to-face time during this encounter.   Kerin Perna, NP

## 2019-03-28 ENCOUNTER — Other Ambulatory Visit: Payer: Self-pay

## 2019-03-28 ENCOUNTER — Inpatient Hospital Stay (HOSPITAL_COMMUNITY): Payer: Medicaid Other

## 2019-03-28 ENCOUNTER — Inpatient Hospital Stay (HOSPITAL_COMMUNITY)
Admission: AD | Admit: 2019-03-28 | Discharge: 2019-03-28 | Disposition: A | Discharge status: Home / Self Care | Payer: Medicaid Other | Attending: Obstetrics and Gynecology | Admitting: Obstetrics and Gynecology

## 2019-03-28 ENCOUNTER — Encounter (HOSPITAL_COMMUNITY): Payer: Self-pay | Admitting: *Deleted

## 2019-03-28 DIAGNOSIS — O26899 Other specified pregnancy related conditions, unspecified trimester: Secondary | ICD-10-CM

## 2019-03-28 DIAGNOSIS — Z793 Long term (current) use of hormonal contraceptives: Secondary | ICD-10-CM | POA: Diagnosis not present

## 2019-03-28 DIAGNOSIS — R110 Nausea: Secondary | ICD-10-CM

## 2019-03-28 DIAGNOSIS — O26891 Other specified pregnancy related conditions, first trimester: Secondary | ICD-10-CM

## 2019-03-28 DIAGNOSIS — R109 Unspecified abdominal pain: Secondary | ICD-10-CM | POA: Diagnosis not present

## 2019-03-28 DIAGNOSIS — O219 Vomiting of pregnancy, unspecified: Secondary | ICD-10-CM | POA: Diagnosis not present

## 2019-03-28 DIAGNOSIS — Z3A01 Less than 8 weeks gestation of pregnancy: Secondary | ICD-10-CM | POA: Insufficient documentation

## 2019-03-28 DIAGNOSIS — O99891 Other specified diseases and conditions complicating pregnancy: Secondary | ICD-10-CM | POA: Diagnosis not present

## 2019-03-28 DIAGNOSIS — M419 Scoliosis, unspecified: Secondary | ICD-10-CM | POA: Insufficient documentation

## 2019-03-28 LAB — URINALYSIS, ROUTINE W REFLEX MICROSCOPIC
Bilirubin Urine: NEGATIVE
Glucose, UA: NEGATIVE mg/dL
Hgb urine dipstick: NEGATIVE
Ketones, ur: NEGATIVE mg/dL
Leukocytes,Ua: NEGATIVE
Nitrite: NEGATIVE
Protein, ur: NEGATIVE mg/dL
Specific Gravity, Urine: 1.009 (ref 1.005–1.030)
pH: 7 (ref 5.0–8.0)

## 2019-03-28 LAB — WET PREP, GENITAL
Clue Cells Wet Prep HPF POC: NONE SEEN
Sperm: NONE SEEN
Trich, Wet Prep: NONE SEEN
Yeast Wet Prep HPF POC: NONE SEEN

## 2019-03-28 LAB — CBC
HCT: 34.8 % — ABNORMAL LOW (ref 36.0–46.0)
Hemoglobin: 12.2 g/dL (ref 12.0–15.0)
MCH: 29.7 pg (ref 26.0–34.0)
MCHC: 35.1 g/dL (ref 30.0–36.0)
MCV: 84.7 fL (ref 80.0–100.0)
Platelets: 225 10*3/uL (ref 150–400)
RBC: 4.11 MIL/uL (ref 3.87–5.11)
RDW: 12.2 % (ref 11.5–15.5)
WBC: 10.1 10*3/uL (ref 4.0–10.5)
nRBC: 0 % (ref 0.0–0.2)

## 2019-03-28 LAB — POCT PREGNANCY, URINE: Preg Test, Ur: POSITIVE — AB

## 2019-03-28 LAB — HCG, QUANTITATIVE, PREGNANCY: hCG, Beta Chain, Quant, S: 79358 m[IU]/mL — ABNORMAL HIGH (ref <5)

## 2019-03-28 NOTE — MAU Provider Note (Addendum)
History     CSN: [id number]  Arrival date and time: 03/28/19 1843   First Provider Initiated Contact with Patient 03/28/19 1953      Chief Complaint  Patient presents with  . Abdominal Pain   28 y.o. [id number] @unsure  gestation presenting for LAP. Pain started about 2 weeks ago. Describes as pressure discomfort in bilateral lower abdomen mostly when she sits or puts pressure onto abdomen. Pain is relieved with standing and position changes. Rates 3/10. She took Ibuprofen originally but nothing recently. Also reports daily nausea, only vomits when she brushes teeth. No fevers. Had 1 episode of diarrhea today. Denies VB or vaginal discharge.    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          Past Medical History:  Diagnosis Date  . PID (acute pelvic inflammatory disease)   . Scoliosis     Past Surgical History:  Procedure Laterality Date  . BACK SURGERY      Family History  Problem Relation Age of Onset  . Hypertension Mother   . Hypertension Father     Social History   Tobacco Use  . Smoking status: Never Smoker  . Smokeless tobacco: Never Used  Substance Use Topics  . Alcohol use: Not Currently    Alcohol/week: 1.0 standard drinks    Types: 1 Glasses of wine per week  . Drug use: No    Allergies: No Known Allergies  Facility-Administered Medications Prior to Admission  Medication Dose Route Frequency Provider Last Rate Last Dose  . medroxyPROGESTERone (DEPO-PROVERA) injection 150 mg  150 mg Intramuscular Q90 days Hermina StaggersErvin, Michael L, MD   150 mg at 08/05/18 1328  . medroxyPROGESTERone (DEPO-PROVERA) injection 150 mg  150 mg Intramuscular Once Hermina StaggersErvin, Michael L, MD       Medications Prior to Admission  Medication Sig Dispense Refill Last Dose  .  bismuth subsalicylate (PEPTO BISMOL) 262 MG chewable tablet Chew 524 mg by mouth as needed.   Past Week at Unknown time  . Dextrose-Fructose-Sod Citrate (NAUZENE PO) Take by mouth.   Past Week at Unknown time  . ibuprofen (ADVIL) 800 MG tablet Take 1 tablet (800 mg total) by mouth every 8 (eight) hours as needed for headache. 90 tablet 1 Past Week at Unknown time  . loratadine (CLARITIN) 10 MG tablet Take 1 tablet (10 mg total) by mouth daily. 30 tablet 11 More than a month at Unknown time  . medroxyPROGESTERone (DEPO-PROVERA) 150 MG/ML injection ADMINISTER 1 ML(150 MG) IN THE MUSCLE EVERY 3 MONTHS 1 mL 4 More than a month at Unknown time  . Melatonin 5 MG TABS Take 1 tablet (5 mg total) by mouth continuous as needed. 30 tablet 3 More than a month at Unknown time  . SUMAtriptan (IMITREX) 50 MG tablet Take 1 tablet (50 mg total) by mouth every 2 (two) hours as needed for migraine. May repeat in 2 hours if headache persists or recurs. 10 tablet 1 More than a month at Unknown time    Review of  Systems  Constitutional: Negative for chills and fever.  Gastrointestinal: Positive for abdominal pain, diarrhea, nausea and vomiting.  Genitourinary: Negative for dysuria, frequency, urgency, vaginal bleeding and vaginal discharge.   Physical Exam   Blood pressure 122/74, pulse 78, temperature 99.3 F (37.4 C), resp. rate 19, weight 75.4 kg, last menstrual period 10/03/2018, SpO2 98 %.  Physical Exam  Nursing note and vitals reviewed. Constitutional: She is oriented to person, place, and time. She appears well-developed and well-nourished.  HENT:  Head: Normocephalic and atraumatic.  Neck: Normal range of motion.  Cardiovascular: Normal rate.  Respiratory: Effort normal. No respiratory distress.  GI: Soft. She exhibits no distension and no mass. There is no abdominal tenderness. There is no rebound and no guarding.  Genitourinary:    Genitourinary Comments: External: no lesions or erythema Vagina:  rugated, pink, moist, thin white discharge Uterus: non enlarged, anteverted, non tender, no CMT Adnexae: no masses, no tenderness left, no tenderness right Cervix closed, normal    Musculoskeletal: Normal range of motion.  Neurological: She is alert and oriented to person, place, and time.  Skin: Skin is warm and dry.  Psychiatric: She has a normal mood and affect.   Results for orders placed or performed during the hospital encounter of 03/28/19 (from the past 24 hour(s))  Pregnancy, urine POC     Status: Abnormal   Collection Time: 03/28/19  7:28 PM  Result Value Ref Range   Preg Test, Ur POSITIVE (A) NEGATIVE  Urinalysis, Routine w reflex microscopic     Status: Abnormal   Collection Time: 03/28/19  7:32 PM  Result Value Ref Range   Color, Urine STRAW (A) YELLOW   APPearance CLEAR CLEAR   Specific Gravity, Urine 1.009 1.005 - 1.030   pH 7.0 5.0 - 8.0   Glucose, UA NEGATIVE NEGATIVE mg/dL   Hgb urine dipstick NEGATIVE NEGATIVE   Bilirubin Urine NEGATIVE NEGATIVE   Ketones, ur NEGATIVE NEGATIVE mg/dL   Protein, ur NEGATIVE NEGATIVE mg/dL   Nitrite NEGATIVE NEGATIVE   Leukocytes,Ua NEGATIVE NEGATIVE  Wet prep, genital     Status: Abnormal   Collection Time: 03/28/19  8:06 PM  Result Value Ref Range   Yeast Wet Prep HPF POC NONE SEEN NONE SEEN   Trich, Wet Prep NONE SEEN NONE SEEN   Clue Cells Wet Prep HPF POC NONE SEEN NONE SEEN   WBC, Wet Prep HPF POC FEW (A) NONE SEEN   Sperm NONE SEEN   CBC     Status: Abnormal   Collection Time: 03/28/19  8:18 PM  Result Value Ref Range   WBC 10.1 4.0 - 10.5 K/uL   RBC 4.11 3.87 - 5.11 MIL/uL   Hemoglobin 12.2 12.0 - 15.0 g/dL   HCT 60.1 (L) 09.3 - 23.5 %   MCV 84.7 80.0 - 100.0 fL   MCH 29.7 26.0 - 34.0 pg   MCHC 35.1 30.0 - 36.0 g/dL   RDW 57.3 22.0 - 25.4 %   Platelets 225 150 - 400 K/uL   nRBC 0.0 0.0 - 0.2 %  hCG, quantitative, pregnancy     Status: Abnormal   Collection Time: 03/28/19  8:18 PM  Result Value Ref Range    hCG, Beta Chain, Quant, S 79,358 (H) <5 mIU/mL    US Ob Less Than 14 Weeks With Ob Transvaginal  Result Date: 03/28/2019 CLINICAL DATA:  Abdominal pain. EXAM: OBSTETRIC <14 WK [place] AND TRANSVAGINAL OB US TECHNIQUE: Both transabdominal and transvaginal ultrasound examinations were performed for  complete evaluation of the gestation as well as the maternal uterus, adnexal regions, and pelvic cul-de-sac. Transvaginal technique was performed to assess early pregnancy. COMPARISON:  None. FINDINGS: Intrauterine gestational sac: Single Yolk sac:  Yes Embryo:  Yes Cardiac Activity: Yes Heart Rate: 144 bpm CRL:  11.68 mm   7 w   2 d                  [place] EDC: 11/12/2019 Subchorionic hemorrhage:  None visualized. Maternal uterus/adnexae: Normal. IMPRESSION: Normal-appearing single intrauterine pregnancy of approximately 7 weeks 2 days gestation. Electronically Signed   By: Lorriane Shire M.D.   On: 03/28/2019 21:04    MAU Course  Procedures  MDM Labs and [place] ordered Transfer of care given to Conrado Nance, [place], [place], [place]  03/28/2019 8:43 PM   [place] reviewed with patient. SIUP noted.   Assessment and Plan   A:  1. [redacted] weeks gestation of pregnancy   2. Abdominal pain affecting pregnancy   3. Nausea     P:  Discharge home in stable condition OTC vitamin B6 and Unisom for nausea Start prenatal care. Call OB Return to MAU if symptoms worsen    Kamiah Fite, Artist Pais, NP 03/28/2019 9:27 PM

## 2019-03-28 NOTE — Discharge Instructions (Signed)
Abdominal Pain During Pregnancy ° °Belly (abdominal) pain is common during pregnancy. There are many possible causes. Most of the time, it is not a serious problem. Other times, it can be a sign that something is wrong with the pregnancy. Always tell your doctor if you have belly pain. °Follow these instructions at home: °· Do not have sex or put anything in your vagina until your pain goes away completely. °· Get plenty of rest until your pain gets better. °· Drink enough fluid to keep your pee (urine) pale yellow. °· Take over-the-counter and prescription medicines only as told by your doctor. °· Keep all follow-up visits as told by your doctor. This is important. °Contact a doctor if: °· Your pain continues or gets worse after resting. °· You have lower belly pain that: °? Comes and goes at regular times. °? Spreads to your back. °? Feels like menstrual cramps. °· You have pain or burning when you pee (urinate). °Get help right away if: °· You have a fever or chills. °· You have vaginal bleeding. °· You are leaking fluid from your vagina. °· You are passing tissue from your vagina. °· You throw up (vomit) for more than 24 hours. °· You have watery poop (diarrhea) for more than 24 hours. °· Your baby is moving less than usual. °· You feel very weak or faint. °· You have shortness of breath. °· You have very bad pain in your upper belly. °Summary °· Belly (abdominal) pain is common during pregnancy. There are many possible causes. °· If you have belly pain during pregnancy, tell your doctor right away. °· Keep all follow-up visits as told by your doctor. This is important. °This information is not intended to replace advice given to you by your health care provider. Make sure you discuss any questions you have with your health care provider. °Document Released: 04/09/2009 Document Revised: 08/09/2018 Document Reviewed: 07/24/2016 °Elsevier Patient Education © 2020 Elsevier Inc. ° °

## 2019-03-28 NOTE — MAU Note (Signed)
Reports abdominal discomfort-intermittent pain and tightness.  Had several + hpt this weekend.  LMP unknown-was on Depo until October and had irregular periods.  No VB/abnormal discharge.

## 2019-03-29 LAB — GC/CHLAMYDIA PROBE AMP (~~LOC~~) NOT AT ARMC
Chlamydia: NEGATIVE
Comment: NEGATIVE
Comment: NORMAL
Neisseria Gonorrhea: NEGATIVE

## 2019-04-06 ENCOUNTER — Telehealth (INDEPENDENT_AMBULATORY_CARE_PROVIDER_SITE_OTHER): Payer: Medicaid Other | Admitting: Primary Care

## 2019-04-06 DIAGNOSIS — Z7689 Persons encountering health services in other specified circumstances: Secondary | ICD-10-CM

## 2019-04-06 DIAGNOSIS — Z0489 Encounter for examination and observation for other specified reasons: Secondary | ICD-10-CM

## 2019-04-06 DIAGNOSIS — Z3A08 8 weeks gestation of pregnancy: Secondary | ICD-10-CM

## 2019-04-06 NOTE — Progress Notes (Signed)
Virtual Visit via Telephone Note  I connected with Erica Hahn on 04/06/19 at  4:10 PM EST by telephone and verified that I am speaking with the correct person using two identifiers.   I discussed the limitations, risks, security and privacy concerns of performing an evaluation and management service by telephone and the availability of in person appointments. I also discussed with the patient that there may be a patient responsible charge related to this service. The patient expressed understanding and agreed to proceed.   History of Present Illness: Ms.Erica Hahn is having a tele visit for establishment of care and she is pregnant and needs a referral to OBGYN.   Past Medical History:  Diagnosis Date  . PID (acute pelvic inflammatory disease)   . Scoliosis    Current Outpatient Medications on File Prior to Visit  Medication Sig Dispense Refill  . loratadine (CLARITIN) 10 MG tablet Take 1 tablet (10 mg total) by mouth daily. 30 tablet 11  . Melatonin 5 MG TABS Take 1 tablet (5 mg total) by mouth continuous as needed. 30 tablet 3   No current facility-administered medications on file prior to visit.   Observations/Objective: Review of Systems  All other systems reviewed and are negative.   Assessment and Plan: Diagnoses and all orders for this visit:  [redacted] weeks gestation of pregnancy -     Ambulatory referral to Obstetrics / Gynecology  Establishing care with new doctor, encounter for [name], NP-C will be your  (PCP) mastered prepared that is able to that will  diagnosed and treatment able to answer health concern as well as continuing care of varied medical conditions, not limited by cause, organ system, or diagnosis.   Other orders -     Prenatal Vit-Fe Fumarate-FA (PRENATAL VITAMIN PLUS LOW IRON) 27-1 MG TABS; Take 27 mg by mouth 1 day or 1 dose for 1 dose.    Follow Up Instructions:    I discussed the assessment and treatment plan with the  patient. The patient was provided an opportunity to ask questions and all were answered. The patient agreed with the plan and demonstrated an understanding of the instructions.   The patient was advised to call back or seek an in-person evaluation if the symptoms worsen or if the condition fails to improve as anticipated.  I provided 14 minutes of non-face-to-face time during this encounter.   Kerin Perna, NP

## 2019-04-17 MED ORDER — PRENATAL VITAMIN PLUS LOW IRON 27-1 MG PO TABS: 27.0000 mg | ORAL_TABLET | ORAL | 3 refills | Status: AC

## 2019-04-19 ENCOUNTER — Ambulatory Visit (INDEPENDENT_AMBULATORY_CARE_PROVIDER_SITE_OTHER): Payer: Medicaid Other | Admitting: *Deleted

## 2019-04-19 ENCOUNTER — Other Ambulatory Visit: Payer: Self-pay

## 2019-04-19 VITALS — Wt 166.0 lb

## 2019-04-19 DIAGNOSIS — O0990 Supervision of high risk pregnancy, unspecified, unspecified trimester: Secondary | ICD-10-CM | POA: Diagnosis not present

## 2019-04-19 DIAGNOSIS — Z3A10 10 weeks gestation of pregnancy: Secondary | ICD-10-CM

## 2019-04-19 DIAGNOSIS — Z3481 Encounter for supervision of other normal pregnancy, first trimester: Secondary | ICD-10-CM

## 2019-04-19 DIAGNOSIS — Z3480 Encounter for supervision of other normal pregnancy, unspecified trimester: Secondary | ICD-10-CM | POA: Insufficient documentation

## 2019-04-19 MED ORDER — BLOOD PRESSURE MONITOR AUTOMAT DEVI: 1.0000 | Freq: Every day | 0 refills | Status: DC

## 2019-04-19 NOTE — Progress Notes (Signed)
  Virtual Visit via Telephone Note  I connected with Erica Hahn on 04/19/19 at  3:00 PM EST by telephone and verified that I am speaking with the correct person using two identifiers.  Location: Patient: Erica Hahn MRN: [id number] Provider: Derl Barrow, RN   I discussed the limitations, risks, security and privacy concerns of performing an evaluation and management service by telephone and the availability of in person appointments. I also discussed with the patient that there may be a patient responsible charge related to this service. The patient expressed understanding and agreed to proceed.   History of Present Illness: PRENATAL INTAKE SUMMARY  Ms. Erica Hahn presents today New OB Nurse Interview.  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         I have reviewed the patient's medical, obstetrical, social, and family histories, medications, and available lab results.  SUBJECTIVE She has no unusual complaints. Patient has history or c-section due to placenta abruption with last pregnancy. Patient requested that she be seen regularly by physician and have more ultrasounds due to high risk pregnancy.    Observations/Objective: Initial nurse interview for history (New OB)  EDD: 11/12/2019 by early ultrasound GA: [redacted]w[redacted]d [id number] FHT: non face to face interview  GENERAL APPEARANCE: non face to face interview  Assessment and Plan: Normal pregnancy Prenatal care Labs to be completed at next visit with midwife Continue PNV Sign up for Babyscripts Rx for blood pressure sent to pharmacy Advised patient that [facility] is low risk OB/Gyn office and mostly Midwives, PA and NPs are providers at this office. Advised patient of when a provider could be in  office but subject to change.  Follow Up Instructions:   I discussed the assessment and treatment plan with the patient. The patient was provided an opportunity to ask questions and all were answered. The patient agreed with the plan and demonstrated an understanding of the instructions.   The patient was advised to call back or seek an in-person evaluation if the symptoms worsen or if the condition fails to improve as anticipated.  I provided 15 minutes of non-face-to-face time during this encounter.   Derl Barrow, RN

## 2019-04-27 ENCOUNTER — Encounter: Payer: Medicaid Other | Admitting: Student

## 2019-05-03 DIAGNOSIS — O0990 Supervision of high risk pregnancy, unspecified, unspecified trimester: Secondary | ICD-10-CM | POA: Diagnosis not present

## 2019-05-13 ENCOUNTER — Ambulatory Visit (INDEPENDENT_AMBULATORY_CARE_PROVIDER_SITE_OTHER): Payer: Medicaid Other

## 2019-05-13 ENCOUNTER — Other Ambulatory Visit: Payer: Self-pay

## 2019-05-13 ENCOUNTER — Encounter: Payer: Self-pay | Admitting: General Practice

## 2019-05-13 ENCOUNTER — Other Ambulatory Visit (HOSPITAL_COMMUNITY)
Admission: RE | Admit: 2019-05-13 | Discharge: 2019-05-13 | Disposition: A | Discharge status: Home / Self Care | Payer: Medicaid Other | Source: Ambulatory Visit | Attending: Student | Admitting: Student

## 2019-05-13 VITALS — BP 115/68 | HR 109 | Temp 98.3°F | Wt 169.8 lb

## 2019-05-13 DIAGNOSIS — Z3482 Encounter for supervision of other normal pregnancy, second trimester: Secondary | ICD-10-CM | POA: Diagnosis not present

## 2019-05-13 DIAGNOSIS — Z8759 Personal history of other complications of pregnancy, childbirth and the puerperium: Secondary | ICD-10-CM | POA: Insufficient documentation

## 2019-05-13 DIAGNOSIS — Z3A13 13 weeks gestation of pregnancy: Secondary | ICD-10-CM

## 2019-05-13 DIAGNOSIS — Z3480 Encounter for supervision of other normal pregnancy, unspecified trimester: Secondary | ICD-10-CM | POA: Diagnosis not present

## 2019-05-13 DIAGNOSIS — Z3481 Encounter for supervision of other normal pregnancy, first trimester: Secondary | ICD-10-CM

## 2019-05-13 DIAGNOSIS — Z98891 History of uterine scar from previous surgery: Secondary | ICD-10-CM | POA: Insufficient documentation

## 2019-05-13 MED ORDER — GOJJI WEIGHT SCALE MISC: 1.0000 | Freq: Every day | 0 refills | Status: DC | PRN

## 2019-05-13 NOTE — Patient Instructions (Signed)
Vaginal Birth After Cesarean Delivery  Vaginal birth after cesarean delivery (VBAC) is giving birth vaginally after previously delivering a baby through a cesarean section (C-section). A VBAC may be a safe option for you, depending on your health and other factors. It is important to discuss VBAC with your health care provider early in your pregnancy so you can understand the risks, benefits, and options. Having these discussions early will give you time to make your birth plan. Who are the best candidates for VBAC? The best candidates for VBAC are women who:  Have had one or two prior cesarean deliveries, and the incision made during the delivery was horizontal (low transverse).  Do not have a vertical (classical) scar on their uterus.  Have not had a tear in the wall of their uterus (uterine rupture).  Plan to have more pregnancies. A VBAC is also more likely to be successful:  In women who have previously given birth vaginally.  When labor starts by itself (spontaneously) before the due date. What are the benefits of VBAC? The benefits of delivering your baby vaginally instead of by a cesarean delivery include:  A shorter hospital stay.  A faster recovery time.  Less pain.  Avoiding risks associated with major surgery, such as infection and blood clots.  Less blood loss and less need for donated blood (transfusions). What are the risks of VBAC? The main risk of attempting a VBAC is that it may fail, forcing your health care provider to deliver your baby by a C-section. Other risks are rare and include:  Tearing (rupture) of the scar from a past cesarean delivery.  Other risks associated with vaginal deliveries. If a repeat cesarean delivery is needed, the risks include:  Blood loss.  Infection.  Blood clot.  Damage to surrounding organs.  Removal of the uterus (hysterectomy), if it is damaged.  Placenta problems in future pregnancies. What else should I know  about my options? Delivering a baby through a VBAC is similar to having a normal spontaneous vaginal delivery. Therefore, it is safe:  To try with twins.  For your health care provider to try to turn the baby from a breech position (external cephalic version) during labor.  With epidural analgesia for pain relief. Consider where you would like to deliver your baby. VBAC should be attempted in facilities where an emergency cesarean delivery can be performed. VBAC is not recommended for home births. Any changes in your health or your baby's health during your pregnancy may make it necessary to change your initial decision about VBAC. Your health care provider may recommend that you do not attempt a VBAC if:  Your baby's suspected weight is 8.8 lb (4 kg) or more.  You have preeclampsia. This is a condition that causes high blood pressure along with other symptoms, such as swelling and headaches.  You will have VBAC less than 19 months after your cesarean delivery.  You are past your due date.  You need to have labor started (induced) because your cervix is not ready for labor (unfavorable). Where to find more information  American Pregnancy Association: americanpregnancy.org  American Congress of Obstetricians and Gynecologists: acog.org Summary  Vaginal birth after cesarean delivery (VBAC) is giving birth vaginally after previously delivering a baby through a cesarean section (C-section). A VBAC may be a safe option for you, depending on your health and other factors.  Discuss VBAC with your health care provider early in your pregnancy so you can understand the risks, benefits, options, and   have plenty of time to make your birth plan.  The main risk of attempting a VBAC is that it may fail, forcing your health care provider to deliver your baby by a C-section. Other risks are rare. This information is not intended to replace advice given to you by your health care provider. Make sure  you discuss any questions you have with your health care provider. Document Revised: 08/17/2018 Document Reviewed: 07/29/2016 Elsevier Patient Education  2020 Elsevier Inc.  

## 2019-05-13 NOTE — Progress Notes (Signed)
Subjective:   Erica Hahn is a 29 y.o. G3P2002 at [redacted]w[redacted]d by LMP and confirmed by early [place].  She is being seen today for her first obstetrical visit.  Her obstetrical history is significant for history of c/s d/t placental abruption. She is unsure of whether or not she wants a repeat c/s or a TOLAC.  Patient does not intend to breast feed. Pregnancy history fully reviewed.  Patient reports that she is not considering birth control and plans to be celibate.  Patient endorses being in a relationship currently, but does not plan to stay in this relationship.  Patient reports occasional contractions.  She reports she started noting the pain at about 8 weeks and states it is "like tightening in my stomach."  Patient states the pain lasts less than a minute and occurs 3-4x/daily.  Patient endorses bouts of constipation f/b soft stools that are loose.  She states she drinks about 3 cups of water daily. She states is taking tylenol, but it is for headaches.   Patient denies vaginal discharge.   Patient expresses some anxiety about previous delivery and current discomforts with pregnancy.  She requests that she receive more frequent visits to avoid placental abruption in future.   HISTORY: OB History  Gravida Para Term Preterm AB Living  3 2 2  0 0 2  SAB TAB Ectopic Multiple Live Births  0 0 0 0 2    # Outcome Date GA Lbr Len/2nd Weight Sex Delivery Anes PTL Lv  3 Current           2 Term 06/28/16 [redacted]w[redacted]d   M CS-Unspec   LIV     Complications: Abruptio Placenta  1 Term 02/11/12   7 lb 12 oz (3.515 kg) M  Spinal N LIV    Obstetric Comments  Has rods in spine and had to have spinal for delivery last time.      Last pap smear was done April 2019 and was normal with an absent ECC.  Past Medical History:  Diagnosis Date  . PID (acute pelvic inflammatory disease)   . Scoliosis    Past Surgical History:  Procedure Laterality Date  . BACK SURGERY     Family History  Problem Relation Age  of Onset  . Hypertension Mother   . Hypertension Father    Social History   Tobacco Use  . Smoking status: Never Smoker  . Smokeless tobacco: Never Used  Substance Use Topics  . Alcohol use: Not Currently    Alcohol/week: 1.0 standard drinks    Types: 1 Glasses of wine per week  . Drug use: No   No Known Allergies Current Outpatient Medications on File Prior to Visit  Medication Sig Dispense Refill  . Blood Pressure Monitoring (BLOOD PRESSURE MONITOR AUTOMAT) DEVI 1 Device by Does not apply route daily. Automatic blood pressure cuff regular size or large. Monitor blood pressure on a regularly bases at home. ICD-10 code: O09.90. 1 each 0   No current facility-administered medications on file prior to visit.    Review of Systems Pertinent items noted in HPI and remainder of comprehensive ROS otherwise negative.  Exam   Vitals:   05/13/19 0832  BP: 115/68  Pulse: (!) 109  Temp: 98.3 F (36.8 C)  Weight: 169 lb 12.8 oz (77 kg)   Fetal Heart Rate (bpm): 150  Physical Exam  Constitutional: She is oriented to person, place, and time and well-developed, well-nourished, and in no distress. No distress.  HENT:  Head: Normocephalic and atraumatic.  Eyes: Conjunctivae are normal.  Neck: No tracheal tenderness present. No tracheal deviation present. No thyroid mass and no thyromegaly present.  Cardiovascular: Normal rate, regular rhythm and normal heart sounds.  Pulmonary/Chest: Effort normal and breath sounds normal. Right breast exhibits no mass, no nipple discharge and no tenderness. Left breast exhibits no mass, no nipple discharge and no tenderness.  Abdominal: Soft. Bowel sounds are normal. There is abdominal tenderness.  Genitourinary: Uterus is enlarged and tender.    No vulval tenderness or rash noted.     Vaginal discharge present.     No lesions in the vagina.     Thin  odorless and white found.     Genitourinary Comments: CV collected  Tenderness at fundus with  palpation.   Musculoskeletal:        General: Normal range of motion.     Cervical back: Normal range of motion.  Neurological: She is alert and oriented to person, place, and time.  Skin: Skin is warm and dry.  Psychiatric: Affect and judgment normal.    150 Doppler for FHR check Assessment:   Pregnancy: [id number] Patient Active Problem List   Diagnosis Date Noted  . Supervision of other normal pregnancy, antepartum 04/19/2019  . History of fusion of spine for scoliosis 11/02/2018  . S/P emergency C-section 07/29/2016     Plan:  1. Supervision of other normal pregnancy, antepartum -Congratulations given and patient welcomed to practice. -Discussed how virtual visits are now utilized for PN visits in midst of coronavirus as a means of decreasing exposure.   -Encouraged to seek out care at office or emergency room for urgent and/or emergent concerns..  -No questions or concerns.  -Anticipatory guidance for prenatal visits including labs, ultrasounds, and testing. -Encouraged to utilize MyChart Registration for her ability to review results, send requests, and have questions addressed.  -Discussed due date based on LMP that is c/w early [place]. -Influenza offered and declined.  - ob urine culture - Obstetric Panel, Including HIV - HgB A1c - Cervicovaginal ancillary only( [place]) - Genetic Screening  2. History of C-section -Unsure of desire for repeat or TOLAC. -Information provided on benefits of VBAC in AVS paperwork. -Will schedule next virtual visit with MD for review of r/b associated with repeat c/s as well as history of abruption. -Patient informed that care may be transferred to high risk clinic if MD deems it appropriate.   3. History of placental abruption -Reassured regarding anxiety and fear related to previous delivery and current pregnancy. -Informed that she is at an increased risk for repeat placental abruption, but that this can not be identified with  increased PNV or ultrasounds.  However, she could be placed on a "normal" visit schedule and not the modified Covid schedule which would still include virtual visits, but of an increased frequency.  -Reviewed clinical s/s of placental abruption including vaginal bleeding, frequent contractions, and drug usage.  Patient denies drug usage.  -Reassured that concerns would be heard and that she can utilize mychart for questions as well as report to MAU for emergencies as they arise.   Initial labs drawn. Continue prenatal vitamins. Genetic Screening discussed, First trimester screen, Quad screen and NIPS: ordered. Ultrasound discussed; fetal anatomic survey: ordered. Problem list reviewed and updated. The nature of [place] [facility] with multiple MDs and other Advanced Practice Providers was explained to patient; also emphasized that residents, students are part of our team. Routine  obstetric precautions reviewed. No follow-ups on file.     Cherre Robins, CNM 05/13/2019 8:56 AM

## 2019-05-14 LAB — OBSTETRIC PANEL, INCLUDING HIV
Antibody Screen: NEGATIVE
Basophils Absolute: 0.1 10*3/uL (ref 0.0–0.2)
Basos: 1 %
EOS (ABSOLUTE): 0.3 10*3/uL (ref 0.0–0.4)
Eos: 2 %
HIV Screen 4th Generation wRfx: NONREACTIVE
Hematocrit: 40.1 % (ref 34.0–46.6)
Hemoglobin: 13.2 g/dL (ref 11.1–15.9)
Hepatitis B Surface Ag: NEGATIVE
Immature Grans (Abs): 0.2 10*3/uL — ABNORMAL HIGH (ref 0.0–0.1)
Immature Granulocytes: 1 %
Lymphocytes Absolute: 1.8 10*3/uL (ref 0.7–3.1)
Lymphs: 12 %
MCH: 29.1 pg (ref 26.6–33.0)
MCHC: 32.9 g/dL (ref 31.5–35.7)
MCV: 89 fL (ref 79–97)
Monocytes Absolute: 0.9 10*3/uL (ref 0.1–0.9)
Monocytes: 6 %
Neutrophils Absolute: 11.5 10*3/uL — ABNORMAL HIGH (ref 1.4–7.0)
Neutrophils: 78 %
Platelets: 215 10*3/uL (ref 150–450)
RBC: 4.53 x10E6/uL (ref 3.77–5.28)
RDW: 12.5 % (ref 11.7–15.4)
RPR Ser Ql: NONREACTIVE
Rh Factor: POSITIVE
Rubella Antibodies, IGG: 2.13 index (ref >0.99)
WBC: 14.7 10*3/uL — ABNORMAL HIGH (ref 3.4–10.8)

## 2019-05-14 LAB — HEMOGLOBIN A1C
Est. average glucose Bld gHb Est-mCnc: 103 mg/dL
Hgb A1c MFr Bld: 5.2 % (ref 4.8–5.6)

## 2019-05-15 LAB — CULTURE, OB URINE

## 2019-05-15 LAB — URINE CULTURE, OB REFLEX

## 2019-05-16 LAB — CERVICOVAGINAL ANCILLARY ONLY
Bacterial Vaginitis (gardnerella): NEGATIVE
Candida Glabrata: NEGATIVE
Candida Vaginitis: POSITIVE — AB
Chlamydia: NEGATIVE
Comment: NEGATIVE
Comment: NEGATIVE
Comment: NEGATIVE
Comment: NEGATIVE
Comment: NEGATIVE
Comment: NORMAL
Neisseria Gonorrhea: NEGATIVE
Trichomonas: NEGATIVE

## 2019-05-17 ENCOUNTER — Encounter: Payer: Self-pay | Admitting: General Practice

## 2019-05-17 ENCOUNTER — Other Ambulatory Visit: Payer: Self-pay

## 2019-05-17 MED ORDER — TERCONAZOLE 0.4 % VA CREA: 1.0000 | TOPICAL_CREAM | Freq: Every day | VAGINAL | 0 refills | Status: DC

## 2019-05-19 ENCOUNTER — Encounter: Payer: Self-pay | Admitting: General Practice

## 2019-05-23 ENCOUNTER — Encounter: Payer: Self-pay | Admitting: General Practice

## 2019-05-30 ENCOUNTER — Ambulatory Visit (INDEPENDENT_AMBULATORY_CARE_PROVIDER_SITE_OTHER): Payer: Medicaid Other | Admitting: Obstetrics and Gynecology

## 2019-05-30 ENCOUNTER — Other Ambulatory Visit: Payer: Self-pay

## 2019-05-30 ENCOUNTER — Encounter: Payer: Self-pay | Admitting: Obstetrics and Gynecology

## 2019-05-30 VITALS — BP 137/78 | HR 84 | Wt 175.0 lb

## 2019-05-30 DIAGNOSIS — Z3480 Encounter for supervision of other normal pregnancy, unspecified trimester: Secondary | ICD-10-CM

## 2019-05-30 DIAGNOSIS — Z98891 History of uterine scar from previous surgery: Secondary | ICD-10-CM

## 2019-05-30 DIAGNOSIS — Z3A16 16 weeks gestation of pregnancy: Secondary | ICD-10-CM

## 2019-05-30 NOTE — Progress Notes (Signed)
Pt would like to discuss TOLAC.

## 2019-05-30 NOTE — Progress Notes (Signed)
   PRENATAL VISIT NOTE  Subjective:  Erica Hahn is a 29 y.o. G3P2002 at [redacted]w[redacted]d being seen today for ongoing prenatal care.  She is currently monitored for the following issues for this low-risk pregnancy and has S/P emergency C-section; History of fusion of spine for scoliosis; Supervision of other normal pregnancy, antepartum; History of C-section; and History of placental abruption on their problem list.  Patient reports no complaints.  Contractions: Not present. Vag. Bleeding: None.  Movement: Present. Denies leaking of fluid.   The following portions of the patient's history were reviewed and updated as appropriate: allergies, current medications, past family history, past medical history, past social history, past surgical history and problem list.   Objective:   Vitals:   05/30/19 0912  BP: 137/78  Pulse: 84  Weight: 175 lb (79.4 kg)    Fetal Status: Fetal Heart Rate (bpm): 145   Movement: Present     General:  Alert, oriented and cooperative. Patient is in no acute distress.  Skin: Skin is warm and dry. No rash noted.   Cardiovascular: Normal heart rate noted  Respiratory: Normal respiratory effort, no problems with respiration noted  Abdomen: Soft, gravid, appropriate for gestational age.  Pain/Pressure: Absent     Pelvic: Cervical exam deferred        Extremities: Normal range of motion.     Mental Status: Normal mood and affect. Normal behavior. Normal judgment and thought content.   Assessment and Plan:  Pregnancy: G3P2002 at [redacted]w[redacted]d 1. Supervision of other normal pregnancy, antepartum Patient is doing well without complaints AFP today Anatomy ultrasound scheduled 2/22  2. History of C-section Patient with previous c-section due to placental abruption Discussed risks, benefits of TOLAC vs RCS. Patient opted for TOLAC. Consent signed today Patient needs to meet with anesthesiologist regarding rods in back Patient also is interested in hematology consult for  possible donating blood in anticipation for possible self transfusion if needed given spiritual beliefs  Preterm labor symptoms and general obstetric precautions including but not limited to vaginal bleeding, contractions, leaking of fluid and fetal movement were reviewed in detail with the patient. Please refer to After Visit Summary for other counseling recommendations.   No follow-ups on file.  Future Appointments  Date Time Provider Department Center  06/08/2019  4:10 PM Gerrit Heck, CNM [facility] None  06/27/2019 10:30 AM [facility] [place] 1 [facility]    Catalina Antigua, MD

## 2019-06-01 LAB — AFP, SERUM, OPEN SPINA BIFIDA
AFP MoM: 1.5
AFP Value: 51.2 ng/mL
Gest. Age on Collection Date: 16.2 weeks
Maternal Age At EDD: 26.8 yr
OSBR Risk 1 IN: 5468
Test Results:: NEGATIVE
Weight: 175 [lb_av]

## 2019-06-03 DIAGNOSIS — O0990 Supervision of high risk pregnancy, unspecified, unspecified trimester: Secondary | ICD-10-CM | POA: Diagnosis not present

## 2019-06-08 ENCOUNTER — Telehealth: Payer: Medicaid Other

## 2019-06-13 ENCOUNTER — Other Ambulatory Visit: Payer: Self-pay | Admitting: *Deleted

## 2019-06-13 DIAGNOSIS — Z3480 Encounter for supervision of other normal pregnancy, unspecified trimester: Secondary | ICD-10-CM

## 2019-06-13 NOTE — Progress Notes (Signed)
Ultrasound order needed to be changed due to [employer] did not approve first order.  Clovis Pu, RN

## 2019-06-24 ENCOUNTER — Ambulatory Visit (INDEPENDENT_AMBULATORY_CARE_PROVIDER_SITE_OTHER): Payer: Medicaid Other | Admitting: *Deleted

## 2019-06-24 ENCOUNTER — Other Ambulatory Visit: Payer: Self-pay

## 2019-06-24 VITALS — BP 116/60 | HR 82 | Temp 98.1°F | Ht 64.0 in | Wt 179.6 lb

## 2019-06-24 DIAGNOSIS — R300 Dysuria: Secondary | ICD-10-CM

## 2019-06-24 DIAGNOSIS — O26899 Other specified pregnancy related conditions, unspecified trimester: Secondary | ICD-10-CM | POA: Diagnosis not present

## 2019-06-24 LAB — POCT URINALYSIS DIPSTICK (MANUAL)
Nitrite, UA: NEGATIVE
Poct Bilirubin: NEGATIVE
Poct Glucose: NORMAL mg/dL
Poct Ketones: NEGATIVE
Poct Protein: NEGATIVE mg/dL
Poct Urobilinogen: NORMAL mg/dL
Spec Grav, UA: 1.02 (ref 1.010–1.025)
pH, UA: 6 (ref 5.0–8.0)

## 2019-06-24 MED ORDER — NITROFURANTOIN MONOHYD MACRO 100 MG PO CAPS: 100.0000 mg | ORAL_CAPSULE | Freq: Two times a day (BID) | ORAL | 0 refills | Status: AC

## 2019-06-24 NOTE — Progress Notes (Signed)
   SUBJECTIVE: Erica Hahn is a 29 y.o. female who complains of  dysuria x 1 days, without flank pain, fever, chills, or abnormal vaginal discharge or bleeding.   OBJECTIVE: Appears well, in no apparent distress.  Vital signs are normal. Urine dipstick shows positive for RBC's and positive for leukocytes.    ASSESSMENT: Dysuria; Urine yellow and cloudy.  PLAN: Treatment per orders- Macrobid 100 mg PO BID x 7 days. Call or return to clinic prn if these symptoms worsen or fail to improve as anticipated.   Clovis Pu, RN

## 2019-06-26 LAB — URINE CULTURE

## 2019-06-27 ENCOUNTER — Ambulatory Visit (HOSPITAL_COMMUNITY)
Admission: RE | Admit: 2019-06-27 | Discharge: 2019-06-27 | Disposition: A | Discharge status: Home / Self Care | Payer: Medicaid Other | Source: Ambulatory Visit | Attending: Obstetrics and Gynecology | Admitting: Obstetrics and Gynecology

## 2019-06-27 ENCOUNTER — Other Ambulatory Visit (HOSPITAL_COMMUNITY): Payer: Self-pay | Admitting: *Deleted

## 2019-06-27 ENCOUNTER — Other Ambulatory Visit: Payer: Self-pay

## 2019-06-27 DIAGNOSIS — Z3480 Encounter for supervision of other normal pregnancy, unspecified trimester: Secondary | ICD-10-CM

## 2019-06-27 DIAGNOSIS — O34219 Maternal care for unspecified type scar from previous cesarean delivery: Secondary | ICD-10-CM | POA: Diagnosis not present

## 2019-06-27 DIAGNOSIS — O09292 Supervision of pregnancy with other poor reproductive or obstetric history, second trimester: Secondary | ICD-10-CM

## 2019-06-27 DIAGNOSIS — Z3A20 20 weeks gestation of pregnancy: Secondary | ICD-10-CM | POA: Diagnosis not present

## 2019-06-27 DIAGNOSIS — Z362 Encounter for other antenatal screening follow-up: Secondary | ICD-10-CM

## 2019-06-29 ENCOUNTER — Telehealth (INDEPENDENT_AMBULATORY_CARE_PROVIDER_SITE_OTHER): Payer: Medicaid Other

## 2019-06-29 ENCOUNTER — Other Ambulatory Visit: Payer: Self-pay

## 2019-06-29 VITALS — BP 130/75 | HR 89 | Wt 179.0 lb

## 2019-06-29 DIAGNOSIS — Z3480 Encounter for supervision of other normal pregnancy, unspecified trimester: Secondary | ICD-10-CM

## 2019-06-29 DIAGNOSIS — Z3A20 20 weeks gestation of pregnancy: Secondary | ICD-10-CM

## 2019-06-29 DIAGNOSIS — Z3009 Encounter for other general counseling and advice on contraception: Secondary | ICD-10-CM

## 2019-06-29 NOTE — Progress Notes (Signed)
TELEHEALTH OBSTETRICS PRENATAL VIRTUAL VIDEO VISIT ENCOUNTER NOTE  Provider location: [facility] [employer] [facility]   I connected with Erica Hahn on 06/29/19 at 10:50 AM EST by MyChart Video Encounter at home and verified that I am speaking with the correct person using two identifiers.   I discussed the limitations, risks, security and privacy concerns of performing an evaluation and management service virtually and the availability of in person appointments. I also discussed with the patient that there may be a patient responsible charge related to this service. The patient expressed understanding and agreed to proceed. Subjective:  Erica Hahn is a 29 y.o. G3P2002 at [redacted]w[redacted]d being seen today for ongoing prenatal care.  She is currently monitored for the following issues for this low-risk pregnancy and has S/P emergency C-section; History of fusion of spine for scoliosis; Supervision of other normal pregnancy, antepartum; History of C-section; and History of placental abruption on their problem list.  Patient reports no complaints. However, she does state that she feels like her UTI has not resolved as she continues to have some discomfort with urination.  Patient expresses desire for permanent sterilization, but goes on to state that she doesn't "want anymore kids, right now."  Patient questions if BTLs are reversible.  Contractions: Irritability. Vag. Bleeding: None.  Movement: Present. Denies any leaking of fluid.   The following portions of the patient's history were reviewed and updated as appropriate: allergies, current medications, past family history, past medical history, past social history, past surgical history and problem list.   Objective:   Vitals:   06/29/19 1130  BP: 130/75  Pulse: 89  Weight: 179 lb (81.2 kg)    Fetal Status:     Movement: Present     General:  Alert, oriented and cooperative. Patient is in no acute distress.  Respiratory:  Normal respiratory effort, no problems with respiration noted  Mental Status: Normal mood and affect. Normal behavior. Normal judgment and thought content.  Rest of physical exam deferred due to type of encounter  Imaging: [place] MFM OB DETAIL +14 WK  Result Date: 06/27/2019 ----------------------------------------------------------------------  OBSTETRICS REPORT                       (Signed Final 06/27/2019 12:05 pm) ---------------------------------------------------------------------- Patient Info  ID #:       [id number]                          D.O.B.:  December 31, 1990 (28 yrs)  Name:       Erica Hahn [facility]              Visit Date: 06/27/2019 10:23 am ---------------------------------------------------------------------- Performed By  Performed By:     Sandi Mealy        Ref. Address:     Faculty Practice                    RDMS  Attending:        Ma Rings MD         [facility]  Referred By:      Gerrit Heck                    CNM ---------------------------------------------------------------------- Orders   #  Description                          Code         Ordered By   1  [place] MFM OB DETAIL +14 WK              76811.01     Lakeesha Fontanilla  ----------------------------------------------------------------------   #  Order #                    Accession #                 Episode #   1  [id number]                  [id number]                  [id number]  ---------------------------------------------------------------------- Indications   Poor obstetrical history(Placenta Abruption)   O09.299   Encounter for antenatal screening for          Z36.3   malformations   Previous cesarean delivery, antepartum         O34.219   [redacted] weeks gestation of pregnancy                Z3A.20  ---------------------------------------------------------------------- Fetal Evaluation  Num Of Fetuses:         1  Fetal Heart Rate(bpm):  155   Cardiac Activity:       Observed  Presentation:           Cephalic  Placenta:               Anterior  P. Cord Insertion:      Visualized  Amniotic Fluid  AFI FV:      Within normal limits                              Largest Pocket(cm)                              5.0 ---------------------------------------------------------------------- Biometry  BPD:      49.6  mm     G. Age:  21w 0d         78  %    CI:        76.85   %    70 - 86                                                          FL/HC:      19.0   %    16.8 - 19.8  HC:      179.2  mm     G. Age:  20w 3d         45  %    HC/AC:      1.05        1.09 - 1.39  AC:      170.2  mm  G. Age:  22w 0d         91  %    FL/BPD:     68.8   %  FL:       34.1  mm     G. Age:  20w 5d         58  %    FL/AC:      20.0   %    20 - 24  HUM:      34.4  mm     G. Age:  21w 5d         90  %  CER:        23  mm     G. Age:  21w 4d         76  %  NFT:       3.1  mm  CM:        4.6  mm  Est. FW:     414  gm    0 lb 15 oz      93  % ---------------------------------------------------------------------- OB History  Gravidity:    3         Term:   2  Living:       2 ---------------------------------------------------------------------- Gestational Age  U/S Today:     21w 0d                                        EDD:   11/07/19  Best:          20w 2d     Det. By:  Previous Ultrasound      EDD:   11/12/19                                      (03/28/19) ---------------------------------------------------------------------- Anatomy  Cranium:               Appears normal         Aortic Arch:            Not well visualized  Cavum:                 Appears normal         Ductal Arch:            Not well visualized  Ventricles:            Not well visualized    Diaphragm:              Appears normal  Choroid Plexus:        Appears normal         Stomach:                Appears normal, left                                                                        sided  Cerebellum:             Appears normal  Abdomen:                Appears normal  Posterior Fossa:       Appears normal         Abdominal Wall:         Appears nml (cord                                                                        insert, abd wall)  Nuchal Fold:           Appears normal         Cord Vessels:           Appears normal (3                                                                        vessel cord)  Face:                  Orbits appear          Kidneys:                Not well visualized                         normal  Lips:                  Appears normal         Bladder:                Appears normal  Thoracic:              Appears normal         Spine:                  Not well visualized  Heart:                 Appears normal         Upper Extremities:      Appears normal                         (4CH, axis, and                         situs)  RVOT:                  Appears normal         Lower Extremities:      Appears normal  LVOT:                  Appears normal  Other:  Normal genaitalia. Parents do not wish to know sex of fetus.          Technically difficult due to fetal position. ---------------------------------------------------------------------- Cervix Uterus Adnexa  Cervix  Length:           3.38  cm.  Normal appearance by transabdominal scan. ---------------------------------------------------------------------- Comments  This patient was seen for a detailed fetal anatomy scan due  to a history of a prior placental abruption.  She denies any other significant past medical history and  denies any problems in her current pregnancy.  She had a cell free DNA test earlier in her pregnancy which  indicated a low risk for trisomy 57, 45, and 13.  The patient  did not want the fetal gender revealed today.  She was informed that the fetal growth and amniotic fluid  level were appropriate for her gestational age.  The views of the fetal anatomy were suboptimal today due to  the fetal position.  The  patient was informed that anomalies may be missed due  to technical limitations. If the fetus is in a suboptimal position  or maternal habitus is increased, visualization of the fetus in  the maternal uterus may be impaired.  She will return in 4 weeks to complete the views of the fetal  anatomy. ----------------------------------------------------------------------                   Johnell Comings, MD Electronically Signed Final Report   06/27/2019 12:05 pm ----------------------------------------------------------------------   Assessment and Plan:  Pregnancy: [id number] at [redacted]w[redacted]d 1. Supervision of other normal pregnancy, antepartum -Anticipatory guidance for upcoming appts. -Reviewed [place] and patient without q/c. -Patient states she feels like she still has a UTI. -Instructed to complete medication and then she can come Friday or Monday for nurse/lab visit.   2. General counseling and advice on female contraception -Extensive discussion regarding BTL/S.   -Informed that procedure is reversible, but should only be considered if desiring permanent sterilization. -Reviewed other LARC methods and information for IUDs placed in AVS.   Preterm labor symptoms and general obstetric precautions including but not limited to vaginal bleeding, contractions, leaking of fluid and fetal movement were reviewed in detail with the patient. I discussed the assessment and treatment plan with the patient. The patient was provided an opportunity to ask questions and all were answered. The patient agreed with the plan and demonstrated an understanding of the instructions. The patient was advised to call back or seek an in-person office evaluation/go to MAU at [facility] for any urgent or concerning symptoms. Please refer to After Visit Summary for other counseling recommendations.   I provided 11 minutes of face-to-face time during this encounter.  No follow-ups on file.  Future Appointments  Date Time  Provider [place]  07/25/2019 12:45 PM [place] [facility]  07/25/2019 12:45 PM [place] for [employer], [place]

## 2019-06-29 NOTE — Patient Instructions (Signed)

## 2019-07-04 ENCOUNTER — Ambulatory Visit (INDEPENDENT_AMBULATORY_CARE_PROVIDER_SITE_OTHER): Payer: Medicaid Other | Admitting: *Deleted

## 2019-07-04 ENCOUNTER — Other Ambulatory Visit: Payer: Self-pay

## 2019-07-04 DIAGNOSIS — R350 Frequency of micturition: Secondary | ICD-10-CM | POA: Diagnosis not present

## 2019-07-04 DIAGNOSIS — R300 Dysuria: Secondary | ICD-10-CM | POA: Diagnosis not present

## 2019-07-04 LAB — POCT URINALYSIS DIPSTICK OB
Bilirubin, UA: NEGATIVE
Blood, UA: NEGATIVE
Glucose, UA: NEGATIVE
Ketones, UA: NEGATIVE
Nitrite, UA: NEGATIVE
POC,PROTEIN,UA: NEGATIVE
Spec Grav, UA: 1.02 (ref 1.010–1.025)
Urobilinogen, UA: 0.2 E.U./dL
pH, UA: 7.5 (ref 5.0–8.0)

## 2019-07-04 NOTE — Progress Notes (Signed)
   SUBJECTIVE: Erica Hahn is a 29 y.o. female who complains of urinary frequency, urgency and dysuria x 7 days, without flank pain, fever, chills, or abnormal vaginal discharge or bleeding.   OBJECTIVE: Appears well, in no apparent distress.  Vital signs are normal. Urine dipstick shows positive for leukocytes.    ASSESSMENT: Dysuria  PLAN: Treatment per orders.  Call or return to clinic prn if these symptoms worsen or fail to improve as anticipated.  Clovis Pu, RN

## 2019-07-07 LAB — CULTURE, OB URINE

## 2019-07-07 LAB — URINE CULTURE, OB REFLEX

## 2019-07-20 ENCOUNTER — Encounter (HOSPITAL_COMMUNITY): Payer: Self-pay

## 2019-07-20 ENCOUNTER — Telehealth (INDEPENDENT_AMBULATORY_CARE_PROVIDER_SITE_OTHER): Payer: Medicaid Other | Admitting: Advanced Practice Midwife

## 2019-07-20 DIAGNOSIS — O34219 Maternal care for unspecified type scar from previous cesarean delivery: Secondary | ICD-10-CM

## 2019-07-20 DIAGNOSIS — Z3A23 23 weeks gestation of pregnancy: Secondary | ICD-10-CM | POA: Diagnosis not present

## 2019-07-20 DIAGNOSIS — Z3480 Encounter for supervision of other normal pregnancy, unspecified trimester: Secondary | ICD-10-CM

## 2019-07-20 NOTE — Progress Notes (Signed)
   TELEHEALTH VIRTUAL OBSTETRICS VISIT ENCOUNTER NOTE  I connected with Erica Hahn on 07/20/19 at 10:10 AM EDT by telephone at home and verified that I am speaking with the correct person using two identifiers.   I discussed the limitations, risks, security and privacy concerns of performing an evaluation and management service by telephone and the availability of in person appointments. I also discussed with the patient that there may be a patient responsible charge related to this service. The patient expressed understanding and agreed to proceed.  Subjective:  Erica Hahn is a 29 y.o. G3P2002 at [redacted]w[redacted]d being followed for ongoing prenatal care.  She is currently monitored for the following issues for this low-risk pregnancy and has S/P emergency C-section; History of fusion of spine for scoliosis; Supervision of other normal pregnancy, antepartum; History of C-section; and History of placental abruption on their problem list.  Patient reports no complaints. Reports fetal movement. Denies any contractions, bleeding or leaking of fluid.   The following portions of the patient's history were reviewed and updated as appropriate: allergies, current medications, past family history, past medical history, past social history, past surgical history and problem list.   Objective:   General:  Alert, oriented and cooperative.   Mental Status: Normal mood and affect perceived. Normal judgment and thought content.  Rest of physical exam deferred due to type of encounter  Assessment and Plan:  Pregnancy: G3P2002 at [redacted]w[redacted]d 1. Supervision of other normal pregnancy, antepartum - Ambulatory referral to Hematology to discuss cell saver/transfusion needs for L&D - Patient considering a BTL, will get papers at next visit - 2 hour GTT and 28 week labs at next visit   Preterm labor symptoms and general obstetric precautions including but not limited to vaginal bleeding, contractions, leaking of  fluid and fetal movement were reviewed in detail with the patient.  I discussed the assessment and treatment plan with the patient. The patient was provided an opportunity to ask questions and all were answered. The patient agreed with the plan and demonstrated an understanding of the instructions. The patient was advised to call back or seek an in-person office evaluation/go to MAU at [facility] for any urgent or concerning symptoms. Please refer to After Visit Summary for other counseling recommendations.   I provided 12 minutes of non-face-to-face time during this encounter.  No follow-ups on file.  Future Appointments  Date Time Provider Department Center  07/25/2019 12:45 PM [facility] NURSE [facility]  07/25/2019 12:45 PM [facility] [place] 2 [facility]-US  08/19/2019  8:10 AM Armando Reichert, CNM [facility] None    Thressa Sheller DNP, CNM  07/20/19  10:42 AM  [facility] [employer], [facility]

## 2019-07-21 ENCOUNTER — Telehealth: Payer: Self-pay | Admitting: Hematology and Oncology

## 2019-07-21 NOTE — Telephone Encounter (Signed)
Received a new hem referral from [facility]. Pt has been cld and scheduled to see Dr. Leonides Schanz on 4/12 at 2pm w/labs at 3:15pm. Pt aware to arrive 15 minutes early.

## 2019-07-25 ENCOUNTER — Ambulatory Visit (HOSPITAL_COMMUNITY)
Admission: RE | Admit: 2019-07-25 | Discharge: 2019-07-25 | Disposition: A | Discharge status: Home / Self Care | Payer: Medicaid Other | Source: Ambulatory Visit | Attending: Obstetrics and Gynecology | Admitting: Obstetrics and Gynecology

## 2019-07-25 ENCOUNTER — Encounter (HOSPITAL_COMMUNITY): Payer: Self-pay

## 2019-07-25 ENCOUNTER — Ambulatory Visit (HOSPITAL_COMMUNITY): Payer: Medicaid Other | Admitting: *Deleted

## 2019-07-25 ENCOUNTER — Other Ambulatory Visit: Payer: Self-pay

## 2019-07-25 VITALS — BP 124/78 | HR 91 | Temp 97.7°F

## 2019-07-25 DIAGNOSIS — O09299 Supervision of pregnancy with other poor reproductive or obstetric history, unspecified trimester: Secondary | ICD-10-CM

## 2019-07-25 DIAGNOSIS — O34219 Maternal care for unspecified type scar from previous cesarean delivery: Secondary | ICD-10-CM

## 2019-07-25 DIAGNOSIS — O0990 Supervision of high risk pregnancy, unspecified, unspecified trimester: Secondary | ICD-10-CM | POA: Diagnosis not present

## 2019-07-25 DIAGNOSIS — Z362 Encounter for other antenatal screening follow-up: Secondary | ICD-10-CM | POA: Diagnosis not present

## 2019-07-25 DIAGNOSIS — Z3480 Encounter for supervision of other normal pregnancy, unspecified trimester: Secondary | ICD-10-CM | POA: Diagnosis not present

## 2019-07-25 DIAGNOSIS — Z3A24 24 weeks gestation of pregnancy: Secondary | ICD-10-CM

## 2019-08-15 ENCOUNTER — Telehealth: Payer: Self-pay | Admitting: *Deleted

## 2019-08-15 ENCOUNTER — Encounter: Payer: Medicaid Other | Admitting: Hematology and Oncology

## 2019-08-15 ENCOUNTER — Other Ambulatory Visit: Payer: Medicaid Other

## 2019-08-15 NOTE — Telephone Encounter (Signed)
TCT patient to advise that Dr. Leonides Schanz has reviewed her referral and determined that she does not need to see [place] today. Due to being a jehovah's witness and that she used "Cell Saver" during her last delivery and did well, a Hematology referral would not add significantly to her situation at this time. Advised that she could call us at anytime, even after delivery if she is concerned about anemia/blood loss.  Pt voiced understanding. appt cancelled for today

## 2019-08-19 ENCOUNTER — Ambulatory Visit (INDEPENDENT_AMBULATORY_CARE_PROVIDER_SITE_OTHER): Payer: Medicaid Other | Admitting: Advanced Practice Midwife

## 2019-08-19 ENCOUNTER — Encounter: Payer: Self-pay | Admitting: General Practice

## 2019-08-19 ENCOUNTER — Other Ambulatory Visit: Payer: Self-pay

## 2019-08-19 ENCOUNTER — Encounter: Payer: Self-pay | Admitting: Advanced Practice Midwife

## 2019-08-19 VITALS — BP 118/74 | HR 81 | Temp 98.2°F | Wt 187.2 lb

## 2019-08-19 DIAGNOSIS — Z3480 Encounter for supervision of other normal pregnancy, unspecified trimester: Secondary | ICD-10-CM | POA: Diagnosis not present

## 2019-08-19 DIAGNOSIS — Z23 Encounter for immunization: Secondary | ICD-10-CM

## 2019-08-19 DIAGNOSIS — Z3A27 27 weeks gestation of pregnancy: Secondary | ICD-10-CM

## 2019-08-19 DIAGNOSIS — R0602 Shortness of breath: Secondary | ICD-10-CM | POA: Diagnosis not present

## 2019-08-19 DIAGNOSIS — R0683 Snoring: Secondary | ICD-10-CM

## 2019-08-19 DIAGNOSIS — O09292 Supervision of pregnancy with other poor reproductive or obstetric history, second trimester: Secondary | ICD-10-CM | POA: Diagnosis not present

## 2019-08-19 DIAGNOSIS — O26892 Other specified pregnancy related conditions, second trimester: Secondary | ICD-10-CM

## 2019-08-19 NOTE — Progress Notes (Signed)
   PRENATAL VISIT NOTE  Subjective:  Erica Hahn is a 29 y.o. G3P2002 at [redacted]w[redacted]d being seen today for ongoing prenatal care.  She is currently monitored for the following issues for this low-risk pregnancy and has S/P emergency C-section; History of fusion of spine for scoliosis; Supervision of other normal pregnancy, antepartum; History of C-section; and History of placental abruption on their problem list.  Patient reports no complaints.  Contractions: Irregular. Vag. Bleeding: None.  Movement: Present. Denies leaking of fluid.   The following portions of the patient's history were reviewed and updated as appropriate: allergies, current medications, past family history, past medical history, past social history, past surgical history and problem list.   Objective:   Vitals:   08/19/19 0825  BP: 118/74  Pulse: 81  Temp: 98.2 F (36.8 C)  Weight: 187 lb 3.2 oz (84.9 kg)    Fetal Status: Fetal Heart Rate (bpm): 150   Movement: Present     General:  Alert, oriented and cooperative. Patient is in no acute distress.  Skin: Skin is warm and dry. No rash noted.   Cardiovascular: Normal heart rate noted  Respiratory: Normal respiratory effort, no problems with respiration noted  Abdomen: Soft, gravid, appropriate for gestational age.  Pain/Pressure: Present     Pelvic: Cervical exam deferred        Extremities: Normal range of motion.  Edema: None  Mental Status: Normal mood and affect. Normal behavior. Normal judgment and thought content.   Assessment and Plan:  Pregnancy: G3P2002 at [redacted]w[redacted]d 1. Supervision of other normal pregnancy, antepartum - Glucose Tolerance, 2 Hours w/1 Hour - HIV Antibody (routine testing w rflx) - RPR - CBC - Tdap vaccine greater than or equal to 7yo IM - Hematology cancelled patient's appt per note from 08/15/19 "Dr. Leonides Schanz has reviewed her referral and determined that she does not need to see [place] today. Due to being a jehovah's witness and that she used  "Cell Saver" during her last delivery and did well, a Hematology referral would not add significantly to her situation at this time. Advised that she could call us at anytime, even after delivery if she is concerned about anemia/blood loss." - Patient reports new onset of snoring, and waking in the night short of breath. She tried sleeping sitting up and that did seem to help some, but the snoring and waking are occurring frequently. Amb referral for sleep study sent.   2. Need for tetanus, diphtheria, and acellular pertussis (Tdap) vaccine in patient of adolescent age or older - Tdap vaccine greater than or equal to 7yo IM  Preterm labor symptoms and general obstetric precautions including but not limited to vaginal bleeding, contractions, leaking of fluid and fetal movement were reviewed in detail with the patient. Please refer to After Visit Summary for other counseling recommendations.   Return in about 3 weeks (around 09/09/2019) for virtual visit .  No future appointments.  Thressa Sheller DNP, CNM  08/19/19  8:44 AM

## 2019-08-20 LAB — CBC
Hematocrit: 37.3 % (ref 34.0–46.6)
Hemoglobin: 12.6 g/dL (ref 11.1–15.9)
MCH: 29.9 pg (ref 26.6–33.0)
MCHC: 33.8 g/dL (ref 31.5–35.7)
MCV: 88 fL (ref 79–97)
Platelets: 182 10*3/uL (ref 150–450)
RBC: 4.22 x10E6/uL (ref 3.77–5.28)
RDW: 12.7 % (ref 11.7–15.4)
WBC: 12.2 10*3/uL — ABNORMAL HIGH (ref 3.4–10.8)

## 2019-08-20 LAB — GLUCOSE TOLERANCE, 2 HOURS W/ 1HR
Glucose, 1 hour: 147 mg/dL (ref 65–179)
Glucose, 2 hour: 125 mg/dL (ref 65–152)
Glucose, Fasting: 78 mg/dL (ref 65–91)

## 2019-08-20 LAB — HIV ANTIBODY (ROUTINE TESTING W REFLEX): HIV Screen 4th Generation wRfx: NONREACTIVE

## 2019-08-20 LAB — RPR: RPR Ser Ql: NONREACTIVE

## 2019-08-25 ENCOUNTER — Ambulatory Visit (INDEPENDENT_AMBULATORY_CARE_PROVIDER_SITE_OTHER): Payer: Medicaid Other | Admitting: Licensed Clinical Social Worker

## 2019-08-25 DIAGNOSIS — Z5329 Procedure and treatment not carried out because of patient's decision for other reasons: Secondary | ICD-10-CM

## 2019-08-25 NOTE — BH Specialist Note (Signed)
Patient was called twice regarding scheduled appointment. Unable to leave voicemail due to mail box not set up.

## 2019-08-25 NOTE — Progress Notes (Signed)
Patient was called twice regarding scheduled appt for elevated phq9 score. Unable to leave voicemail due to mail box not set up.

## 2019-08-31 ENCOUNTER — Encounter: Payer: Medicaid Other | Admitting: Licensed Clinical Social Worker

## 2019-09-08 ENCOUNTER — Telehealth (INDEPENDENT_AMBULATORY_CARE_PROVIDER_SITE_OTHER): Payer: Medicaid Other | Admitting: Obstetrics and Gynecology

## 2019-09-08 DIAGNOSIS — Z3480 Encounter for supervision of other normal pregnancy, unspecified trimester: Secondary | ICD-10-CM

## 2019-09-08 DIAGNOSIS — O4703 False labor before 37 completed weeks of gestation, third trimester: Secondary | ICD-10-CM

## 2019-09-08 DIAGNOSIS — Z3A30 30 weeks gestation of pregnancy: Secondary | ICD-10-CM

## 2019-09-08 NOTE — Patient Instructions (Signed)
Preterm Labor and Birth Information ° °The normal length of a pregnancy is 39-41 weeks. Preterm labor is when labor starts before 37 completed weeks of pregnancy. °What are the risk factors for preterm labor? °Preterm labor is more likely to occur in women who: °· Have certain infections during pregnancy such as a bladder infection, sexually transmitted infection, or infection inside the uterus (chorioamnionitis). °· Have a shorter-than-normal cervix. °· Have gone into preterm labor before. °· Have had surgery on their cervix. °· Are younger than age 17 or older than age 35. °· Are African American. °· Are pregnant with twins or multiple babies (multiple gestation). °· Take street drugs or smoke while pregnant. °· Do not gain enough weight while pregnant. °· Became pregnant shortly after having been pregnant. °What are the symptoms of preterm labor? °Symptoms of preterm labor include: °· Cramps similar to those that can happen during a menstrual period. The cramps may happen with diarrhea. °· Pain in the abdomen or lower back. °· Regular uterine contractions that may feel like tightening of the abdomen. °· A feeling of increased pressure in the pelvis. °· Increased watery or bloody mucus discharge from the vagina. °· Water breaking (ruptured amniotic sac). °Why is it important to recognize signs of preterm labor? °It is important to recognize signs of preterm labor because babies who are born prematurely may not be fully developed. This can put them at an increased risk for: °· Long-term (chronic) heart and lung problems. °· Difficulty immediately after birth with regulating body systems, including blood sugar, body temperature, heart rate, and breathing rate. °· Bleeding in the brain. °· Cerebral palsy. °· Learning difficulties. °· Death. °These risks are highest for babies who are born before 34 weeks of pregnancy. °How is preterm labor treated? °Treatment depends on the length of your pregnancy, your condition,  and the health of your baby. It may involve: °· Having a stitch (suture) placed in your cervix to prevent your cervix from opening too early (cerclage). °· Taking or being given medicines, such as: °? Hormone medicines. These may be given early in pregnancy to help support the pregnancy. °? Medicine to stop contractions. °? Medicines to help mature the baby’s lungs. These may be prescribed if the risk of delivery is high. °? Medicines to prevent your baby from developing cerebral palsy. °If the labor happens before 34 weeks of pregnancy, you may need to stay in the hospital. °What should I do if I think I am in preterm labor? °If you think that you are going into preterm labor, call your health care provider right away. °How can I prevent preterm labor in future pregnancies? °To increase your chance of having a full-term pregnancy: °· Do not use any tobacco products, such as cigarettes, chewing tobacco, and e-cigarettes. If you need help quitting, ask your health care provider. °· Do not use street drugs or medicines that have not been prescribed to you during your pregnancy. °· Talk with your health care provider before taking any herbal supplements, even if you have been taking them regularly. °· Make sure you gain a healthy amount of weight during your pregnancy. °· Watch for infection. If you think that you might have an infection, get it checked right away. °· Make sure to tell your health care provider if you have gone into preterm labor before. °This information is not intended to replace advice given to you by your health care provider. Make sure you discuss any questions you have with your   health care provider. Document Revised: 08/13/2018 Document Reviewed: 09/12/2015 Elsevier Patient Education  [place].   Preventing Preterm Birth Preterm birth is when your baby is delivered between 51 weeks and 71 weeks of pregnancy. A full-term pregnancy lasts for at least 37 weeks. Preterm birth can be  dangerous for your baby because the last few weeks of pregnancy are an important time for your baby's brain and lungs to grow. Many things can cause a baby to be born early. Sometimes the cause is not known. There are certain factors that make you more likely to experience preterm birth, such as:  Having a previous baby born preterm.  Being pregnant with twins or other multiples.  Having had fertility treatment.  Being overweight or underweight at the start of your pregnancy.  Having any of the following during pregnancy: ? An infection, including a urinary tract infection (UTI) or an STI (sexually transmitted infection). ? High blood pressure. ? Diabetes. ? Vaginal bleeding.  Being age 28 or older.  Being age 75 or younger.  Getting pregnant within 6 months of a previous pregnancy.  Suffering extreme stress or physical or emotional abuse during pregnancy.  Standing for long periods of time during pregnancy, such as working at a job that requires standing. What are the risks? The most serious risk of preterm birth is that the baby may not survive. This is more likely to happen if a baby is born before 41 weeks. Other risks and complications of preterm birth may include your baby having:  Breathing problems.  Brain damage that affects movement and coordination (cerebral palsy).  Feeding difficulties.  Vision or hearing problems.  Infections or inflammation of the digestive tract (colitis).  Developmental delays.  Learning disabilities.  Higher risk for diabetes, heart disease, and high blood pressure later in life. What can I do to lower my risk?  Medical care The most important thing you can do to lower your risk for preterm birth is to get routine medical care during pregnancy (prenatal care). If you have a high risk of preterm birth, you may be referred to a health care provider who specializes in managing high-risk pregnancies (perinatologist). You may be given  medicine to help prevent preterm birth. Lifestyle changes Certain lifestyle changes can also lower your risk of preterm birth:  Wait at least 6 months after a pregnancy to become pregnant again.  Try to plan pregnancy for when you are between 46 and 32 years old.  Get to a healthy weight before getting pregnant. If you are overweight, work with your health care provider to safely lose weight.  Do not use any products that contain nicotine or tobacco, such as cigarettes and e-cigarettes. If you need help quitting, ask your health care provider.  Do not drink alcohol.  Do not use drugs. Where to find support For more support, consider:  Talking with your health care provider.  Talking with a therapist or substance abuse counselor, if you need help quitting.  Working with a diet and nutrition specialist (dietitian) or a Physiological scientist to maintain a healthy weight.  Joining a support group. Where to find more information Learn more about preventing preterm birth from:  Centers for Disease Control and Prevention: VoipObserver.com.br  March of Dimes: marchofdimes.org/complications/premature-babies.aspx  American Pregnancy Association: americanpregnancy.org/labor-and-birth/premature-labor Contact a health care provider if:  You have any of the following signs of preterm labor before 37 weeks: ? A change or increase in vaginal discharge. ? Fluid leaking from your vagina. ?  Pressure or cramps in your lower abdomen. ? A backache that does not go away or gets worse. ? Regular tightening (contractions) in your lower abdomen. Summary  Preterm birth means having your baby during weeks 20-37 of pregnancy.  Preterm birth may put your baby at risk for physical and mental problems.  Getting good prenatal care can help prevent preterm birth.  You can lower your risk of preterm birth by making certain lifestyle changes, such as not smoking  and not using alcohol. This information is not intended to replace advice given to you by your health care provider. Make sure you discuss any questions you have with your health care provider. Document Revised: 04/03/2017 Document Reviewed: 12/29/2015 Elsevier Patient Education  2020 [employer].

## 2019-09-08 NOTE — Progress Notes (Signed)
   MY CHART VIDEO VIRTUAL OBSTETRICS VISIT ENCOUNTER NOTE  I connected with Erica Hahn on 09/08/19 at  4:10 PM EDT by My Chart video at home and verified that I am speaking with the correct person using two identifiers.   I discussed the limitations, risks, security and privacy concerns of performing an evaluation and management service by My Chart video and the availability of in person appointments. I also discussed with the patient that there may be a patient responsible charge related to this service. The patient expressed understanding and agreed to proceed.  Subjective:  Erica Hahn is a 29 y.o. G3P2002 at [redacted]w[redacted]d being followed for ongoing prenatal care.  She is currently monitored for the following issues for this low-risk pregnancy and has S/P emergency C-section; History of fusion of spine for scoliosis; Supervision of other normal pregnancy, antepartum; History of C-section; and History of placental abruption on their problem list.  Patient reports occasional contractions. She reports "really strong BH ctxs" for 20-30 mins ~ 4x/day. Reports fetal movement. Denies any contractions, bleeding or leaking of fluid.   The following portions of the patient's history were reviewed and updated as appropriate: allergies, current medications, past family history, past medical history, past social history, past surgical history and problem list.   Objective:   General:  Alert, oriented and cooperative.   Mental Status: Normal mood and affect perceived. Normal judgment and thought content.  Rest of physical exam deferred due to type of encounter  LMP 10/03/2018 (LMP Unknown)  **Done by patient's own at home BP cuff and scale  Assessment and Plan:  Pregnancy: G3P2002 at [redacted]w[redacted]d  1. Supervision of other normal pregnancy, antepartum - Discussed BH ctxs should not be painful  Preterm labor symptoms and general obstetric precautions including but not limited to vaginal bleeding,  contractions, leaking of fluid and fetal movement were reviewed in detail with the patient.  I discussed the assessment and treatment plan with the patient. The patient was provided an opportunity to ask questions and all were answered. The patient agreed with the plan and demonstrated an understanding of the instructions. The patient was advised to call back or seek an in-person office evaluation/go to [facility] at [facility] for any urgent or concerning symptoms. Please refer to After Visit Summary for other counseling recommendations.   I provided 5 minutes of non-face-to-face time during this encounter. There was 5 minutes of chart review time spent prior to this encounter. Total time spent = 10 minutes.  Return in about 2 weeks (around 09/22/2019) for Return OB - My Chart video.  Future Appointments  Date Time Provider Department Center  09/23/2019  8:30 AM Gerrit Heck, CNM [facility] None    Raelyn Mora, CNM Center for [employer], [facility]

## 2019-09-18 ENCOUNTER — Encounter: Payer: Self-pay | Admitting: Obstetrics and Gynecology

## 2019-09-23 ENCOUNTER — Ambulatory Visit (INDEPENDENT_AMBULATORY_CARE_PROVIDER_SITE_OTHER): Payer: Medicaid Other

## 2019-09-23 ENCOUNTER — Other Ambulatory Visit (HOSPITAL_COMMUNITY)
Admission: RE | Admit: 2019-09-23 | Discharge: 2019-09-23 | Disposition: A | Discharge status: Home / Self Care | Payer: Medicaid Other | Source: Ambulatory Visit

## 2019-09-23 ENCOUNTER — Other Ambulatory Visit: Payer: Self-pay

## 2019-09-23 VITALS — BP 107/72 | HR 88 | Temp 98.3°F | Wt 192.6 lb

## 2019-09-23 DIAGNOSIS — N898 Other specified noninflammatory disorders of vagina: Secondary | ICD-10-CM

## 2019-09-23 DIAGNOSIS — Z3480 Encounter for supervision of other normal pregnancy, unspecified trimester: Secondary | ICD-10-CM

## 2019-09-23 DIAGNOSIS — O26893 Other specified pregnancy related conditions, third trimester: Secondary | ICD-10-CM

## 2019-09-23 DIAGNOSIS — O3429 Maternal care due to uterine scar from other previous surgery: Secondary | ICD-10-CM

## 2019-09-23 DIAGNOSIS — Z3A32 32 weeks gestation of pregnancy: Secondary | ICD-10-CM

## 2019-09-23 DIAGNOSIS — Z302 Encounter for sterilization: Secondary | ICD-10-CM

## 2019-09-23 DIAGNOSIS — M5432 Sciatica, left side: Secondary | ICD-10-CM | POA: Insufficient documentation

## 2019-09-23 DIAGNOSIS — Z98891 History of uterine scar from previous surgery: Secondary | ICD-10-CM

## 2019-09-23 NOTE — Progress Notes (Signed)
   PRENATAL VISIT NOTE  Subjective:  Erica Hahn is a 29 y.o. G3P2002 at [redacted]w[redacted]d who presents today for routine prenatal care.  She is currently being monitored for supervision of a low-risk pregnancy with problems as listed below.  Patient reports some sciatica pain stating "I have been having that buttocks and leg pain."  She endorses that the pain starts in her left buttocks and radiates down her leg and she describes the pain as "an aching and stinging sensation."  She endorses fetal movement and BH contractions that have been painful.  She also reports some vaginal discharge that has been ongoing for the last month. Patient reports sexual activity without issues yesterday.   Patient Active Problem List   Diagnosis Date Noted  . Sciatica of left side without back pain 09/23/2019  . History of C-section 05/13/2019  . History of placental abruption 05/13/2019  . Supervision of other normal pregnancy, antepartum 04/19/2019  . History of fusion of spine for scoliosis 11/02/2018  . S/P emergency C-section 07/29/2016    The following portions of the patient's history were reviewed and updated as appropriate: allergies, current medications, past family history, past medical history, past social history, past surgical history and problem list. Problem list updated.  Objective:   Vitals:   09/23/19 0830  BP: 107/72  Pulse: 88  Temp: 98.3 F (36.8 C)  Weight: 192 lb 9.6 oz (87.4 kg)    Fetal Status: Fetal Heart Rate (bpm): 147 Fundal Height: 36 cm Movement: Present  Presentation: Transverse  General:  Alert, oriented and cooperative. Patient is in no acute distress.  Skin: Skin is warm and dry.   Cardiovascular: Regular rate and rhythm.  Respiratory: Normal respiratory effort. CTA-Bilaterally  Abdomen: Soft, gravid, appropriate for gestational age.  Pelvic: Cervical exam performed Dilation: Closed Effacement (%): Thick Station: Ballotable Speculum Exam: NEFG.  Small amt thin  yellowish discharge.  CV collected.   Extremities: Normal range of motion.  Edema: Trace  Mental Status: Normal mood and affect. Normal behavior. Normal judgment and thought content.   Assessment and Plan:  Pregnancy: G3P2002 at [redacted]w[redacted]d  1. Supervision of other normal pregnancy, antepartum -Anticipatory guidance for upcoming appts. -Reassured that she can contact office 24 hrs/day or go to MAU for emergent concerns. -Discussed history of scoliosis, back surgery, and rod placement in setting of epidural.  Reassured that this has been seen, by anesthesiologists, on several occassions.    2. Sciatica of left side without back pain -Discussed normalcy of sciatica pain during pregnancy. -Patient aware that it usually resolves after delivery. -Offered PT and patient declines.   3. Vaginal discharge during pregnancy in third trimester -CV collected.  4. History of C-section -MD consult completed. -Plan for TOLAC.  5. Request for sterilization -BTL papers valid as of April 16th.   Preterm labor symptoms and general obstetric precautions including but not limited to vaginal bleeding, contractions, leaking of fluid and fetal movement were reviewed with the patient.  Please refer to After Visit Summary for other counseling recommendations.  Return in about 4 weeks (around 10/21/2019) for LROB with GBS.  Future Appointments  Date Time Provider Department Center  10/21/2019  8:50 AM Gerrit Heck, CNM [facility] None    Cherre Robins, CNM 09/23/2019, 9:51 AM

## 2019-09-26 LAB — CERVICOVAGINAL ANCILLARY ONLY
Bacterial Vaginitis (gardnerella): NEGATIVE
Candida Glabrata: NEGATIVE
Candida Vaginitis: POSITIVE — AB
Comment: NEGATIVE
Comment: NEGATIVE
Comment: NEGATIVE

## 2019-09-28 ENCOUNTER — Other Ambulatory Visit (HOSPITAL_COMMUNITY): Payer: Self-pay

## 2019-09-28 DIAGNOSIS — B3731 Acute candidiasis of vulva and vagina: Secondary | ICD-10-CM

## 2019-09-28 MED ORDER — TERCONAZOLE 0.8 % VA CREA: 1.0000 | TOPICAL_CREAM | Freq: Every day | VAGINAL | 0 refills | Status: DC

## 2019-10-06 ENCOUNTER — Telehealth (INDEPENDENT_AMBULATORY_CARE_PROVIDER_SITE_OTHER): Payer: Medicaid Other | Admitting: Primary Care

## 2019-10-06 DIAGNOSIS — M25531 Pain in right wrist: Secondary | ICD-10-CM | POA: Diagnosis not present

## 2019-10-06 DIAGNOSIS — M25532 Pain in left wrist: Secondary | ICD-10-CM

## 2019-10-06 DIAGNOSIS — Z7689 Persons encountering health services in other specified circumstances: Secondary | ICD-10-CM | POA: Diagnosis not present

## 2019-10-06 NOTE — Progress Notes (Addendum)
Virtual Visit via Telephone Note  I connected with Erica Hahn on 10/06/19 at 11:10 AM EDT by telephone and verified that I am speaking with the correct person using two identifiers.   I discussed the limitations, risks, security and privacy concerns of performing an evaluation and management service by telephone and the availability of in person appointments. I also discussed with the patient that there may be a patient responsible charge related to this service. The patient expressed understanding and agreed to proceed. Patient location home Gwinda Passe, NP location [facility]  History of Present Illness: Erica Hahn is a 29 year female who is establishing care and diagnosis herself with carpal tunnel bilateral wrist pain and difficulty lifting object. She is currently approximately 34-35 weeks. During her present condition only medication I can suggest is Tylenol and ortho refer. Previously seen for sciatica pain and refused physical therapy. Past Medical History:  Diagnosis Date  . PID (acute pelvic inflammatory disease)   . Scoliosis    Current Outpatient Medications on File Prior to Visit  Medication Sig Dispense Refill  . acetaminophen (TYLENOL) 500 MG tablet Take 500 mg by mouth every 6 (six) hours as needed.    . Blood Pressure Monitoring (BLOOD PRESSURE MONITOR AUTOMAT) DEVI 1 Device by Does not apply route daily. Automatic blood pressure cuff regular size or large. Monitor blood pressure on a regularly bases at home. ICD-10 code: O09.90. 1 each 0  . Misc. Devices (GOJJI WEIGHT SCALE) MISC 1 Device by Does not apply route daily as needed. Weight self daily as needed at home. ICD-10 code: O0.90 1 each 0  . Prenatal Vit-Fe Fumarate-FA (MULTIVITAMIN-PRENATAL) 27-0.8 MG TABS tablet Take 1 tablet by mouth daily at 12 noon.    Marland Kitchen terconazole (TERAZOL 3) 0.8 % vaginal cream Place 1 applicator vaginally at bedtime. 20 g 0   No current  facility-administered medications on file prior to visit.   Observations/Objective: Review of Systems  Musculoskeletal:       Bilateral wrist pain  Neurological: Positive for weakness.  All other systems reviewed and are negative.   Assessment and Plan: Diagnoses and all orders for this visit:  Establishing care with new doctor, encounter for Gwinda Passe, NP-C will be your  (PCP) she is mastered prepared . She is skilled to diagnosed and treat illness. Also able to answer health concern as well as continuing care of varied medical conditions, not limited by cause, organ system, or diagnosis.   Bilateral wrist pain Unknown etiology and due to weeks of pregnancy it would not be recommended optional  Radiation x-rays. She may use tylenol. Discussed with OBGYN what she can take for pain. After delivery if pains continue will evaluate for carpal tunnel and refer to ortho   Follow Up Instructions:    I discussed the assessment and treatment plan with the patient. The patient was provided an opportunity to ask questions and all were answered. The patient agreed with the plan and demonstrated an understanding of the instructions.   The patient was advised to call back or seek an in-person evaluation if the symptoms worsen or if the condition fails to improve as anticipated.  I provided 18 minutes of non-face-to-face time during this encounter. Review encounters, labs, imaging    Grayce Sessions, NP

## 2019-10-21 ENCOUNTER — Other Ambulatory Visit (HOSPITAL_COMMUNITY)
Admission: RE | Admit: 2019-10-21 | Discharge: 2019-10-21 | Disposition: A | Discharge status: Home / Self Care | Payer: Medicaid Other | Source: Ambulatory Visit

## 2019-10-21 ENCOUNTER — Other Ambulatory Visit: Payer: Self-pay

## 2019-10-21 ENCOUNTER — Ambulatory Visit (INDEPENDENT_AMBULATORY_CARE_PROVIDER_SITE_OTHER): Payer: Medicaid Other

## 2019-10-21 VITALS — BP 117/78 | HR 86 | Temp 98.1°F

## 2019-10-21 DIAGNOSIS — Z3480 Encounter for supervision of other normal pregnancy, unspecified trimester: Secondary | ICD-10-CM | POA: Insufficient documentation

## 2019-10-21 DIAGNOSIS — Z3483 Encounter for supervision of other normal pregnancy, third trimester: Secondary | ICD-10-CM

## 2019-10-21 DIAGNOSIS — N898 Other specified noninflammatory disorders of vagina: Secondary | ICD-10-CM

## 2019-10-21 DIAGNOSIS — O26893 Other specified pregnancy related conditions, third trimester: Secondary | ICD-10-CM

## 2019-10-21 NOTE — Progress Notes (Signed)
   LOW-RISK PREGNANCY OFFICE VISIT  Patient name: Erica Hahn MRN [id number]  Date of birth: 1991-04-14 Chief Complaint:   Routine Prenatal Visit   Subjective:   Erica Hahn is a 29 y.o. G48P2002 female at [redacted]w[redacted]d with an Estimated Date of Delivery: 11/12/19 being seen today for ongoing management of a low-risk pregnancy. Today she reports fatigue. Patient reports that she is ready to have the baby. Patient reports continued discharge despite treatment with Terazol. She reports she was unsure of how much cream to put in the applicator.   Contractions: Irregular. Vag. Bleeding: None.  Movement: Absent. denies leaking of fluid.  Reviewed past medical,surgical, social, obstetrical and family history as well as problem list, medications and allergies.  Objective   Vitals:   10/21/19 0858  BP: 117/78  Pulse: 86  Temp: 98.1 F (36.7 C)  There is no height or weight on file to calculate BMI.        Physical Examination:   General appearance: Well appearing, and in no distress  Mental status: Alert, oriented to person, place, and time  Skin: Warm & dry  Cardiovascular: Normal heart rate noted  Respiratory: Normal respiratory effort, no distress  Abdomen: Soft, gravid, nontender, AGA with Fundal height of Fundal Height: 38 cm  Pelvic: Cervical exam performed  Dilation: Closed Effacement (%): Thick Station: Ballotable Presentation: Vertex confirmed by hand held [place] Extremities: Edema: Trace  Fetal Status: Fetal Heart Rate (bpm): 136  Movement: Absent   No results found for this or any previous visit (from the past 24 hour(s)).  Assessment & Plan:  Low-risk pregnancy of a 29 y.o., G3P2002 at [redacted]w[redacted]d with an Estimated Date of Delivery: 11/12/19   1. Supervision of other normal pregnancy, antepartum -Anticipatory guidance for today. -Instructed on how to properly fill and insert applicator for vaginal infection.  -Patient unsure of sex of infant and states that if a boy she still  desires sterilization.   2. Vaginal discharge during pregnancy in third trimester -Instructed to repeat Terazol dosing. -Discussed correct method of usage; attach tube to applicator and squeeze until injector can no longer go to top then insert all into vagina. -Patient instructed to contact office if symptoms remain or worsen after completion of 3 day treatment.    Meds: No orders of the defined types were placed in this encounter.  Labs/procedures today:  Lab Orders     Culture, beta strep (group b only)   Reviewed: Term labor symptoms and general obstetric precautions including but not limited to vaginal bleeding, contractions, leaking of fluid and fetal movement were reviewed in detail with the patient.  All questions were answered.  Follow-up: No follow-ups on file.  Orders Placed This Encounter  Procedures  . Culture, beta strep (group b only)   Cherre Robins MSN, CNM 10/21/2019

## 2019-10-24 ENCOUNTER — Telehealth: Payer: Self-pay | Admitting: *Deleted

## 2019-10-24 DIAGNOSIS — N760 Acute vaginitis: Secondary | ICD-10-CM

## 2019-10-24 LAB — CERVICOVAGINAL ANCILLARY ONLY
Bacterial Vaginitis (gardnerella): POSITIVE — AB
Candida Glabrata: NEGATIVE
Candida Vaginitis: POSITIVE — AB
Chlamydia: NEGATIVE
Comment: NEGATIVE
Comment: NEGATIVE
Comment: NEGATIVE
Comment: NEGATIVE
Comment: NORMAL
Neisseria Gonorrhea: NEGATIVE

## 2019-10-24 MED ORDER — METRONIDAZOLE 500 MG PO TABS: 500.0000 mg | ORAL_TABLET | Freq: Two times a day (BID) | ORAL | 0 refills | Status: DC

## 2019-10-24 NOTE — Telephone Encounter (Cosign Needed)
Patient called requesting test results from previous office visit on 10/21/19. Patient had vaginal cultures completed. DOB verified. Pt positive for BV and yeast. Per office notes patient to continue with previous Rx terconazole 0.8% vaginal cream x 3 days for yeast. Patient reported on day #2. Metronidazole 500 mg 1 PO BID x 7 days with food sent to pharmacy. Patient to call if symptoms persist.  Clovis Pu, RN

## 2019-10-25 LAB — CULTURE, BETA STREP (GROUP B ONLY): Strep Gp B Culture: NEGATIVE

## 2019-10-26 ENCOUNTER — Encounter: Payer: Medicaid Other | Admitting: Advanced Practice Midwife

## 2019-11-04 ENCOUNTER — Other Ambulatory Visit: Payer: Self-pay

## 2019-11-04 ENCOUNTER — Ambulatory Visit (INDEPENDENT_AMBULATORY_CARE_PROVIDER_SITE_OTHER): Payer: Medicaid Other | Admitting: Obstetrics and Gynecology

## 2019-11-04 VITALS — BP 130/80 | HR 81 | Temp 98.5°F | Wt 197.4 lb

## 2019-11-04 DIAGNOSIS — Z3A38 38 weeks gestation of pregnancy: Secondary | ICD-10-CM

## 2019-11-04 DIAGNOSIS — O34219 Maternal care for unspecified type scar from previous cesarean delivery: Secondary | ICD-10-CM

## 2019-11-04 DIAGNOSIS — Z3480 Encounter for supervision of other normal pregnancy, unspecified trimester: Secondary | ICD-10-CM

## 2019-11-04 NOTE — Progress Notes (Signed)
   LOW-RISK PREGNANCY OFFICE VISIT Patient name: Erica Hahn MRN [id number]  Date of birth: [birth year] Chief Complaint:   Routine Prenatal Visit  History of Present Illness:   Erica Hahn is a 29 y.o. G78P2002 female at [redacted]w[redacted]d with an Estimated Date of Delivery: 11/12/19 being seen today for ongoing management of a low-risk pregnancy.  Today she reports occasional contractions. Contractions: Irregular. Vag. Bleeding: None.  Movement: Present. denies leaking of fluid. Review of Systems:   Pertinent items are noted in HPI Denies abnormal vaginal discharge w/ itching/odor/irritation, headaches, visual changes, shortness of breath, chest pain, abdominal pain, severe nausea/vomiting, or problems with urination or bowel movements unless otherwise stated above. Pertinent History Reviewed:  Reviewed past medical,surgical, social, obstetrical and family history.  Reviewed problem list, medications and allergies. Physical Assessment:   Vitals:   11/04/19 0807  BP: 130/80  Pulse: 81  Temp: 98.5 F (36.9 C)  Weight: 197 lb 6.4 oz (89.5 kg)  Body mass index is 33.88 kg/m.        Physical Examination:   General appearance: Well appearing, and in no distress  Mental status: Alert, oriented to person, place, and time  Skin: Warm & dry  Cardiovascular: Normal heart rate noted  Respiratory: Normal respiratory effort, no distress  Abdomen: Soft, gravid, nontender  Pelvic: Cervical exam performed  Dilation: Closed Effacement (%): 50 Station: -2  Extremities: Edema: None  Fetal Status: Fetal Heart Rate (bpm): 139 Fundal Height: 39 cm Movement: Present Presentation: Vertex  No results found for this or any previous visit (from the past 24 hour(s)).  Assessment & Plan:  1) Low-risk pregnancy G3P2002 at [redacted]w[redacted]d with an Estimated Date of Delivery: 11/12/19   2) Supervision of other normal pregnancy, antepartum - Discussed labor signs and symptoms  3) Previous cesarean delivery affecting  pregnancy - TOLAC consent signed 05/30/2019 with Dr. Jolayne Panther   Meds: No orders of the defined types were placed in this encounter.  Labs/procedures today: cervical exam  Plan:  Continue routine obstetrical care   Reviewed: Term labor symptoms and general obstetric precautions including but not limited to vaginal bleeding, contractions, leaking of fluid and fetal movement were reviewed in detail with the patient.  All questions were answered. Has home bp cuff. Check bp weekly, let us know if >140/90.   Follow-up: Return in about 1 week (around 11/11/2019) for Return OB visit.  No orders of the defined types were placed in this encounter.  Raelyn Mora MSN, CNM 11/04/2019

## 2019-11-04 NOTE — Patient Instructions (Signed)

## 2019-11-09 ENCOUNTER — Ambulatory Visit (INDEPENDENT_AMBULATORY_CARE_PROVIDER_SITE_OTHER): Payer: Medicaid Other | Admitting: Certified Nurse Midwife

## 2019-11-09 ENCOUNTER — Other Ambulatory Visit: Payer: Self-pay

## 2019-11-09 VITALS — BP 137/87 | HR 86 | Temp 98.2°F | Wt 196.4 lb

## 2019-11-09 DIAGNOSIS — Z3483 Encounter for supervision of other normal pregnancy, third trimester: Secondary | ICD-10-CM

## 2019-11-09 DIAGNOSIS — Z3A39 39 weeks gestation of pregnancy: Secondary | ICD-10-CM

## 2019-11-09 DIAGNOSIS — Z8759 Personal history of other complications of pregnancy, childbirth and the puerperium: Secondary | ICD-10-CM

## 2019-11-09 DIAGNOSIS — Z98891 History of uterine scar from previous surgery: Secondary | ICD-10-CM

## 2019-11-09 DIAGNOSIS — Z3480 Encounter for supervision of other normal pregnancy, unspecified trimester: Secondary | ICD-10-CM

## 2019-11-09 NOTE — Patient Instructions (Signed)
First Stage of Labor Labor is your body's natural process of moving your baby and other structures, including the placenta and umbilical cord, out of your uterus. There are three stages of labor. How long each stage lasts is different for every woman. But certain events happen during each stage that are the same for everyone.  The first stage starts when true labor begins. This stage ends when your cervix, which is the opening from your uterus into your vagina, is completely open (dilated).  The second stage begins when your cervix is fully dilated and you start pushing. This stage ends when your baby is born.  The third stage is the delivery of the organ that nourished your baby during pregnancy (placenta). First stage of labor As your due date gets closer, you may start to notice certain physical changes that mean labor is going to start soon. You may feel that your baby has dropped lower into your pelvis. You may experience irregular, often painless, contractions that go away when you walk around or lie down (Braxton Hicks contractions). This is also called false labor. The first stage of labor begins when you start having contractions that come at regular (evenly spaced) intervals and your cervix starts to get thinner and wider in preparation for your baby to pass through. Birth care providers measure the dilation of your cervix in centimeters (cm). One centimeter is a little less than one-half of an inch. The first stage ends when your cervix is dilated to 10 cm. The first stage of labor is divided into three phases:  Early phase.  Active phase.  Transitional phase. The length of the first stage of labor varies. It may be longer if this is your first pregnancy. You may spend most of this stage at home trying to relax and stay comfortable. How does this affect me? During the first stage of labor, you will move through three phases. What happens in the early phase?  You will start to have  regular contractions that last 30-60 seconds. Contractions may come every 5-20 minutes. Keep track of your contractions and call your birth care provider.  Your water may break during this phase.  You may notice a clear or slightly bloody discharge of mucus (mucus plug) from your vagina.  Your cervix will dilate to 3-6 cm. What happens in the active phase? The active phase usually lasts 3-5 hours. You may go to the hospital or birth center around this time. During the active phase:  Your contractions will become stronger, longer, and more uncomfortable.  Your contractions may last 45-90 seconds and come every 3-5 minutes.  You may feel lower back pain.  Your birth care providers may examine your cervix and feel your belly to find the position of your baby.  You may have a monitor strapped to your belly to measure your contractions and your baby's heart rate.  You may start using your pain management options.  Your cervix may be dilated to 6 cm and may start to dilate more quickly. What happens in the transitional phase? The transitional phase typically lasts from 30 minutes to 2 hours. At the end of this phase, your cervix will be fully dilated to 10 cm. During the transitional phase:  Contractions will get stronger and longer.  Contractions may last 60-90 seconds and come less than 2 minutes apart.  You may feel hot flashes, chills, or nausea. How does this affect my baby? During the first stage of labor, your baby will   gradually move down into your birth canal. Follow these instructions at home and in the hospital or birth center:   When labor first begins, try to stay calm. You are still in the early phase. If it is night, try to get some sleep. If it is day, try to relax and save your energy. You may want to make some calls and get ready to go to the hospital or birth center.  When you are in the early phase, try these methods to help ease discomfort: ? Deep breathing and  muscle relaxation. ? Taking a walk. ? Taking a warm bath or shower.  Drink some fluids and have a light snack if you feel like it.  Keep track of your contractions.  Based on the plan you created with your birth care provider, call when your contractions indicate it is time.  If your water breaks, note the time, color, and odor of the fluid.  When you are in the active phase, do your breathing exercises and rely on your support people and your team of birth care providers. Contact a health care provider if:  Your contractions are strong and regular.  You have lower back pain or cramping.  Your water breaks.  You lose your mucus plug. Get help right away if you:  Have a severe headache that does not go away.  Have changes in your vision.  Have severe pain in your upper belly.  Do not feel the baby move.  Have bright red bleeding. Summary  The first stage of labor starts when true labor begins, and it ends when your cervix is dilated to 10 cm.  The first stage of labor has three phases: early, active, and transitional.  Your baby moves into the birth canal during the first stage of labor.  You may have contractions that become stronger and longer. You may also lose your mucus plug and have your water break.  Call your birth care provider when your contractions are frequent and strong enough to go to the hospital or birth center. This information is not intended to replace advice given to you by your health care provider. Make sure you discuss any questions you have with your health care provider. Document Revised: 08/12/2018 Document Reviewed: 07/05/2017 Elsevier Patient Education  2020 [employer].   Labor Induction  Labor induction is when steps are taken to cause a pregnant woman to begin the labor process. Most women go into labor on their own between 37 weeks and 42 weeks of pregnancy. When this does not happen or when there is a medical need for labor to  begin, steps may be taken to induce labor. Labor induction causes a pregnant woman's uterus to contract. It also causes the cervix to soften (ripen), open (dilate), and thin out (efface). Usually, labor is not induced before 39 weeks of pregnancy unless there is a medical reason to do so. Your health care provider will determine if labor induction is needed. Before inducing labor, your health care provider will consider a number of factors, including:  Your medical condition and your baby's.  How many weeks along you are in your pregnancy.  How mature your baby's lungs are.  The condition of your cervix.  The position of your baby.  The size of your birth canal. What are some reasons for labor induction? Labor may be induced if:  Your health or your baby's health is at risk.  Your pregnancy is overdue by 1 week or more.  Your water breaks but labor does not start on its own.  There is a low amount of amniotic fluid around your baby. You may also choose (elect) to have labor induced at a certain time. Generally, elective labor induction is done no earlier than 39 weeks of pregnancy. What methods are used for labor induction? Methods used for labor induction include:  Prostaglandin medicine. This medicine starts contractions and causes the cervix to dilate and ripen. It can be taken by mouth (orally) or by being inserted into the vagina (suppository).  Inserting a small, thin tube (catheter) with a balloon into the vagina and then expanding the balloon with water to dilate the cervix.  Stripping the membranes. In this method, your health care provider gently separates amniotic sac tissue from the cervix. This causes the cervix to stretch, which in turn causes the release of a hormone called progesterone. The hormone causes the uterus to contract. This procedure is often done during an office visit, after which you will be sent home to wait for contractions to begin.  Breaking the  water. In this method, your health care provider uses a small instrument to make a small hole in the amniotic sac. This eventually causes the amniotic sac to break. Contractions should begin after a few hours.  Medicine to trigger or strengthen contractions. This medicine is given through an IV that is inserted into a vein in your arm. Except for membrane stripping, which can be done in a clinic, labor induction is done in the hospital so that you and your baby can be carefully monitored. How long does it take for labor to be induced? The length of time it takes to induce labor depends on how ready your body is for labor. Some inductions can take up to 2-3 days, while others may take less than a day. Induction may take longer if:  You are induced early in your pregnancy.  It is your first pregnancy.  Your cervix is not ready. What are some risks associated with labor induction? Some risks associated with labor induction include:  Changes in fetal heart rate, such as being too high, too low, or irregular (erratic).  Failed induction.  Infection in the mother or the baby.  Increased risk of having a cesarean delivery.  Fetal death.  Breaking off (abruption) of the placenta from the uterus (rare).  Rupture of the uterus (very rare). When induction is needed for medical reasons, the benefits of induction generally outweigh the risks. What are some reasons for not inducing labor? Labor induction should not be done if:  Your baby does not tolerate contractions.  You have had previous surgeries on your uterus, such as a myomectomy, removal of fibroids, or a vertical scar from a previous cesarean delivery.  Your placenta lies very low in your uterus and blocks the opening of the cervix (placenta previa).  Your baby is not in a head-down position.  The umbilical cord drops down into the birth canal in front of the baby.  There are unusual circumstances, such as the baby being very  early (premature).  You have had more than 2 previous cesarean deliveries. Summary  Labor induction is when steps are taken to cause a pregnant woman to begin the labor process.  Labor induction causes a pregnant woman's uterus to contract. It also causes the cervix to ripen, dilate, and efface.  Labor is not induced before 39 weeks of pregnancy unless there is a medical reason to do so.  When  When induction is needed for medical reasons, the benefits of induction generally outweigh the risks. This information is not intended to replace advice given to you by your health care provider. Make sure you discuss any questions you have with your health care provider. Document Revised: 04/24/2017 Document Reviewed: 06/04/2016 Elsevier Patient Education  2020 [employer]  

## 2019-11-09 NOTE — Progress Notes (Signed)
   PRENATAL VISIT NOTE  Subjective:  Erica Hahn is a 29 y.o. G3P2002 at [redacted]w[redacted]d being seen today for ongoing prenatal care.  She is currently monitored for the following issues for this low-risk pregnancy and has S/P emergency C-section; History of fusion of spine for scoliosis; Supervision of other normal pregnancy, antepartum; History of C-section; History of placental abruption; and Sciatica of left side without back pain on their problem list.  Patient reports no bleeding, no contractions, no cramping, no leaking, occasional contractions and sciatic pain.  Contractions: Irregular. Vag. Bleeding: None.  Movement: Present. Denies leaking of fluid.   The following portions of the patient's history were reviewed and updated as appropriate: allergies, current medications, past family history, past medical history, past social history, past surgical history and problem list.   Objective:   Vitals:   11/09/19 0806  BP: 137/87  Pulse: 86  Temp: 98.2 F (36.8 C)  Weight: 196 lb 6.4 oz (89.1 kg)    Fetal Status: Fetal Heart Rate (bpm): 141 Fundal Height: 39 cm Movement: Present  Presentation: Vertex  General:  Alert, oriented and cooperative. Patient is in no acute distress.  Skin: Skin is warm and dry. No rash noted.   Cardiovascular: Normal heart rate noted  Respiratory: Normal respiratory effort, no problems with respiration noted  Abdomen: Soft, gravid, appropriate for gestational age.  Pain/Pressure: Absent     Pelvic: Cervical exam performed in the presence of a chaperone Dilation: Closed Effacement (%): 50 Station: -2  Extremities: Normal range of motion.  Edema: None  Mental Status: Normal mood and affect. Normal behavior. Normal judgment and thought content.   Assessment and Plan:  Pregnancy: G3P2002 at [redacted]w[redacted]d 1. Supervision of other normal pregnancy, antepartum - discussed induction, wants to schedule at next visit if labor has not started  Term labor symptoms and  general obstetric precautions including but not limited to vaginal bleeding, contractions, leaking of fluid and fetal movement were reviewed in detail with the patient. Please refer to After Visit Summary for other counseling recommendations.   Return in about 1 week (around 11/16/2019) for LOB & NST.  Future Appointments  Date Time Provider Department Center  11/16/2019  8:10 AM Marny Lowenstein, PA-C [facility] None    [facility], [place]

## 2019-11-12 ENCOUNTER — Inpatient Hospital Stay (HOSPITAL_COMMUNITY): Admission: RE | Admit: 2019-11-12 | Payer: Medicaid Other | Source: Home / Self Care

## 2019-11-16 ENCOUNTER — Encounter: Payer: Self-pay | Admitting: Medical

## 2019-11-16 ENCOUNTER — Encounter: Payer: Self-pay | Admitting: General Practice

## 2019-11-16 ENCOUNTER — Ambulatory Visit (INDEPENDENT_AMBULATORY_CARE_PROVIDER_SITE_OTHER): Payer: Medicaid Other | Admitting: Medical

## 2019-11-16 ENCOUNTER — Other Ambulatory Visit (HOSPITAL_COMMUNITY)
Admission: RE | Admit: 2019-11-16 | Discharge: 2019-11-16 | Disposition: A | Discharge status: Home / Self Care | Payer: Medicaid Other | Source: Ambulatory Visit | Attending: Family Medicine | Admitting: Family Medicine

## 2019-11-16 ENCOUNTER — Other Ambulatory Visit: Payer: Self-pay

## 2019-11-16 ENCOUNTER — Encounter (HOSPITAL_COMMUNITY): Payer: Self-pay | Admitting: *Deleted

## 2019-11-16 ENCOUNTER — Telehealth (HOSPITAL_COMMUNITY): Payer: Self-pay | Admitting: *Deleted

## 2019-11-16 VITALS — BP 126/89 | HR 92 | Temp 98.0°F | Wt 197.6 lb

## 2019-11-16 DIAGNOSIS — O09293 Supervision of pregnancy with other poor reproductive or obstetric history, third trimester: Secondary | ICD-10-CM

## 2019-11-16 DIAGNOSIS — Z3480 Encounter for supervision of other normal pregnancy, unspecified trimester: Secondary | ICD-10-CM

## 2019-11-16 DIAGNOSIS — Z8759 Personal history of other complications of pregnancy, childbirth and the puerperium: Secondary | ICD-10-CM

## 2019-11-16 DIAGNOSIS — Z98891 History of uterine scar from previous surgery: Secondary | ICD-10-CM

## 2019-11-16 DIAGNOSIS — M5432 Sciatica, left side: Secondary | ICD-10-CM

## 2019-11-16 DIAGNOSIS — O34219 Maternal care for unspecified type scar from previous cesarean delivery: Secondary | ICD-10-CM

## 2019-11-16 DIAGNOSIS — O99891 Other specified diseases and conditions complicating pregnancy: Secondary | ICD-10-CM

## 2019-11-16 DIAGNOSIS — Z3A40 40 weeks gestation of pregnancy: Secondary | ICD-10-CM

## 2019-11-16 LAB — SARS CORONAVIRUS 2 (TAT 6-24 HRS): SARS Coronavirus 2: NEGATIVE

## 2019-11-16 NOTE — Telephone Encounter (Signed)
Preadmission screen  

## 2019-11-16 NOTE — Patient Instructions (Signed)
Fetal Movement Counts Patient Name: ________________________________________________ Patient Due Date: ____________________ What is a fetal movement count?  A fetal movement count is the number of times that you feel your baby move during a certain amount of time. This may also be called a fetal kick count. A fetal movement count is recommended for every pregnant woman. You may be asked to start counting fetal movements as early as week 28 of your pregnancy. Pay attention to when your baby is most active. You may notice your baby's sleep and wake cycles. You may also notice things that make your baby move more. You should do a fetal movement count:  When your baby is normally most active.  At the same time each day. A good time to count movements is while you are resting, after having something to eat and drink. How do I count fetal movements? 1. Find a quiet, comfortable area. Sit, or lie down on your side. 2. Write down the date, the start time and stop time, and the number of movements that you felt between those two times. Take this information with you to your health care visits. 3. Write down your start time when you feel the first movement. 4. Count kicks, flutters, swishes, rolls, and jabs. You should feel at least 10 movements. 5. You may stop counting after you have felt 10 movements, or if you have been counting for 2 hours. Write down the stop time. 6. If you do not feel 10 movements in 2 hours, contact your health care provider for further instructions. Your health care provider may want to do additional tests to assess your baby's well-being. Contact a health care provider if:  You feel fewer than 10 movements in 2 hours.  Your baby is not moving like he or she usually does. Date: ____________ Start time: ____________ Stop time: ____________ Movements: ____________ Date: ____________ Start time: ____________ Stop time: ____________ Movements: ____________ Date: ____________  Start time: ____________ Stop time: ____________ Movements: ____________ Date: ____________ Start time: ____________ Stop time: ____________ Movements: ____________ Date: ____________ Start time: ____________ Stop time: ____________ Movements: ____________ Date: ____________ Start time: ____________ Stop time: ____________ Movements: ____________ Date: ____________ Start time: ____________ Stop time: ____________ Movements: ____________ Date: ____________ Start time: ____________ Stop time: ____________ Movements: ____________ Date: ____________ Start time: ____________ Stop time: ____________ Movements: ____________ This information is not intended to replace advice given to you by your health care provider. Make sure you discuss any questions you have with your health care provider. Document Revised: 12/09/2018 Document Reviewed: 12/09/2018 Elsevier Patient Education  2020 [employer] Braxton Hicks Contractions Contractions of the uterus can occur throughout pregnancy, but they are not always a sign that you are in labor. You may have practice contractions called Braxton Hicks contractions. These false labor contractions are sometimes confused with true labor. What are Braxton Hicks contractions? Braxton Hicks contractions are tightening movements that occur in the muscles of the uterus before labor. Unlike true labor contractions, these contractions do not result in opening (dilation) and thinning of the cervix. Toward the end of pregnancy (32-34 weeks), Braxton Hicks contractions can happen more often and may become stronger. These contractions are sometimes difficult to tell apart from true labor because they can be very uncomfortable. You should not feel embarrassed if you go to the hospital with false labor. Sometimes, the only way to tell if you are in true labor is for your health care provider to look for changes in the cervix. The health care provider   will do a physical exam and may  monitor your contractions. If you are not in true labor, the exam should show that your cervix is not dilating and your water has not broken. If there are no other health problems associated with your pregnancy, it is completely safe for you to be sent home with false labor. You may continue to have Braxton Hicks contractions until you go into true labor. How to tell the difference between true labor and false labor True labor  Contractions last 30-70 seconds.  Contractions become very regular.  Discomfort is usually felt in the top of the uterus, and it spreads to the lower abdomen and low back.  Contractions do not go away with walking.  Contractions usually become more intense and increase in frequency.  The cervix dilates and gets thinner. False labor  Contractions are usually shorter and not as strong as true labor contractions.  Contractions are usually irregular.  Contractions are often felt in the front of the lower abdomen and in the groin.  Contractions may go away when you walk around or change positions while lying down.  Contractions get weaker and are shorter-lasting as time goes on.  The cervix usually does not dilate or become thin. Follow these instructions at home:   Take over-the-counter and prescription medicines only as told by your health care provider.  Keep up with your usual exercises and follow other instructions from your health care provider.  Eat and drink lightly if you think you are going into labor.  If Braxton Hicks contractions are making you uncomfortable: ? Change your position from lying down or resting to walking, or change from walking to resting. ? Sit and rest in a tub of warm water. ? Drink enough fluid to keep your urine pale yellow. Dehydration may cause these contractions. ? Do slow and deep breathing several times an hour.  Keep all follow-up prenatal visits as told by your health care provider. This is important. Contact a  health care provider if:  You have a fever.  You have continuous pain in your abdomen. Get help right away if:  Your contractions become stronger, more regular, and closer together.  You have fluid leaking or gushing from your vagina.  You pass blood-tinged mucus (bloody show).  You have bleeding from your vagina.  You have low back pain that you never had before.  You feel your baby's head pushing down and causing pelvic pressure.  Your baby is not moving inside you as much as it used to. Summary  Contractions that occur before labor are called Braxton Hicks contractions, false labor, or practice contractions.  Braxton Hicks contractions are usually shorter, weaker, farther apart, and less regular than true labor contractions. True labor contractions usually become progressively stronger and regular, and they become more frequent.  Manage discomfort from Braxton Hicks contractions by changing position, resting in a warm bath, drinking plenty of water, or practicing deep breathing. This information is not intended to replace advice given to you by your health care provider. Make sure you discuss any questions you have with your health care provider. Document Revised: 04/03/2017 Document Reviewed: 09/04/2016 Elsevier Patient Education  2020 Elsevier Inc.  

## 2019-11-16 NOTE — Progress Notes (Signed)
   PRENATAL VISIT NOTE  Subjective:  Erica Hahn is a 29 y.o. G3P2002 at [redacted]w[redacted]d being seen today for ongoing prenatal care.  She is currently monitored for the following issues for this high-risk pregnancy and has S/P emergency C-section; History of fusion of spine for scoliosis; Supervision of other normal pregnancy, antepartum; History of C-section; History of placental abruption; and Sciatica of left side without back pain on their problem list.  Patient reports occasional contractions.  Contractions: Irregular. Vag. Bleeding: None.  Movement: Present. Denies leaking of fluid.   The following portions of the patient's history were reviewed and updated as appropriate: allergies, current medications, past family history, past medical history, past social history, past surgical history and problem list.   Objective:   Vitals:   11/16/19 0828  BP: 126/89  Pulse: 92  Temp: 98 F (36.7 C)  Weight: 197 lb 9.6 oz (89.6 kg)    Fetal Status: Fetal Heart Rate (bpm): 147   Movement: Present  Presentation: Vertex  General:  Alert, oriented and cooperative. Patient is in no acute distress.  Skin: Skin is warm and dry. No rash noted.   Cardiovascular: Normal heart rate noted  Respiratory: Normal respiratory effort, no problems with respiration noted  Abdomen: Soft, gravid, appropriate for gestational age.  Pain/Pressure: Present     Pelvic: Cervical exam performed in the presence of a chaperone Dilation: Closed Effacement (%): 50 Station: -3  Extremities: Normal range of motion.  Edema: None  Mental Status: Normal mood and affect. Normal behavior. Normal judgment and thought content.   Assessment and Plan:  Pregnancy: G3P2002 at [redacted]w[redacted]d 1. Supervision of other normal pregnancy, antepartum - Desires IOL today or tomorrow, already post dates, orders entered, able to schedule for tomorrow AM  - BTL consent signed 08/19/19  2. History of C-section - TOLAC consent signed previously   3.  History of placental abruption  4. Sciatica of left side without back pain  Term labor symptoms and general obstetric precautions including but not limited to vaginal bleeding, contractions, leaking of fluid and fetal movement were reviewed in detail with the patient. Please refer to After Visit Summary for other counseling recommendations.   Return in about 4 weeks (around 12/14/2019) for PP visit.  Future Appointments  Date Time Provider Department Center  11/17/2019  6:30 AM [facility] SCHED ROOM [facility] None    Vonzella Nipple, PA-C

## 2019-11-17 ENCOUNTER — Inpatient Hospital Stay (HOSPITAL_COMMUNITY): Payer: Medicaid Other | Admitting: Anesthesiology

## 2019-11-17 ENCOUNTER — Encounter: Payer: Self-pay | Admitting: General Practice

## 2019-11-17 ENCOUNTER — Inpatient Hospital Stay (HOSPITAL_COMMUNITY)
Admission: AD | Admit: 2019-11-17 | Discharge: 2019-11-19 | DRG: 785 | Disposition: A | Discharge status: Home / Self Care | Payer: Medicaid Other | Attending: Obstetrics & Gynecology | Admitting: Obstetrics & Gynecology

## 2019-11-17 ENCOUNTER — Encounter (HOSPITAL_COMMUNITY): Payer: Self-pay | Admitting: Obstetrics and Gynecology

## 2019-11-17 ENCOUNTER — Other Ambulatory Visit: Payer: Self-pay

## 2019-11-17 ENCOUNTER — Encounter (HOSPITAL_COMMUNITY)
Admission: AD | Disposition: A | Discharge status: Home / Self Care | Payer: Self-pay | Source: Home / Self Care | Attending: Obstetrics & Gynecology

## 2019-11-17 ENCOUNTER — Inpatient Hospital Stay (HOSPITAL_COMMUNITY): Payer: Medicaid Other

## 2019-11-17 DIAGNOSIS — O34211 Maternal care for low transverse scar from previous cesarean delivery: Secondary | ICD-10-CM | POA: Diagnosis present

## 2019-11-17 DIAGNOSIS — Z98891 History of uterine scar from previous surgery: Secondary | ICD-10-CM

## 2019-11-17 DIAGNOSIS — O9902 Anemia complicating childbirth: Secondary | ICD-10-CM | POA: Diagnosis present

## 2019-11-17 DIAGNOSIS — Z3A40 40 weeks gestation of pregnancy: Secondary | ICD-10-CM

## 2019-11-17 DIAGNOSIS — Z302 Encounter for sterilization: Secondary | ICD-10-CM

## 2019-11-17 DIAGNOSIS — O480 Post-term pregnancy: Principal | ICD-10-CM | POA: Diagnosis present

## 2019-11-17 DIAGNOSIS — Z981 Arthrodesis status: Secondary | ICD-10-CM

## 2019-11-17 DIAGNOSIS — O26893 Other specified pregnancy related conditions, third trimester: Secondary | ICD-10-CM | POA: Diagnosis present

## 2019-11-17 DIAGNOSIS — Z9079 Acquired absence of other genital organ(s): Secondary | ICD-10-CM

## 2019-11-17 DIAGNOSIS — Z20822 Contact with and (suspected) exposure to covid-19: Secondary | ICD-10-CM | POA: Diagnosis present

## 2019-11-17 DIAGNOSIS — Z8759 Personal history of other complications of pregnancy, childbirth and the puerperium: Secondary | ICD-10-CM

## 2019-11-17 DIAGNOSIS — Z8739 Personal history of other diseases of the musculoskeletal system and connective tissue: Secondary | ICD-10-CM

## 2019-11-17 DIAGNOSIS — Z880 Allergy status to penicillin: Secondary | ICD-10-CM | POA: Diagnosis not present

## 2019-11-17 DIAGNOSIS — D573 Sickle-cell trait: Secondary | ICD-10-CM | POA: Diagnosis present

## 2019-11-17 DIAGNOSIS — Z3480 Encounter for supervision of other normal pregnancy, unspecified trimester: Secondary | ICD-10-CM

## 2019-11-17 HISTORY — PX: TUBAL LIGATION: SHX77

## 2019-11-17 LAB — CBC
HCT: 40.2 % (ref 36.0–46.0)
Hemoglobin: 13.4 g/dL (ref 12.0–15.0)
MCH: 28.9 pg (ref 26.0–34.0)
MCHC: 33.3 g/dL (ref 30.0–36.0)
MCV: 86.8 fL (ref 80.0–100.0)
Platelets: 170 10*3/uL (ref 150–400)
RBC: 4.63 MIL/uL (ref 3.87–5.11)
RDW: 13.3 % (ref 11.5–15.5)
WBC: 7.5 10*3/uL (ref 4.0–10.5)
nRBC: 0 % (ref 0.0–0.2)

## 2019-11-17 LAB — TYPE AND SCREEN
ABO/RH(D): O POS
Antibody Screen: NEGATIVE

## 2019-11-17 LAB — ABO/RH: ABO/RH(D): O POS

## 2019-11-17 LAB — RPR: RPR Ser Ql: NONREACTIVE

## 2019-11-17 SURGERY — Surgical Case
Anesthesia: Epidural | Wound class: Clean Contaminated

## 2019-11-17 MED ORDER — OXYTOCIN-SODIUM CHLORIDE 30-0.9 UT/500ML-% IV SOLN
2.5000 [IU]/h | INTRAVENOUS | Status: AC
  Administered 2019-11-18: 2.5 [IU]/h via INTRAVENOUS
  Filled 2019-11-17: qty 500

## 2019-11-17 MED ORDER — MORPHINE SULFATE (PF) 10 MG/ML IV SOLN
INTRAVENOUS | Status: DC | PRN
  Administered 2019-11-17: 3 mg via EPIDURAL

## 2019-11-17 MED ORDER — MENTHOL 3 MG MT LOZG: 1.0000 | LOZENGE | OROMUCOSAL | Status: DC | PRN

## 2019-11-17 MED ORDER — LIDOCAINE-EPINEPHRINE (PF) 2 %-1:200000 IJ SOLN
INTRAMUSCULAR | Status: AC
  Filled 2019-11-17: qty 10

## 2019-11-17 MED ORDER — LIDOCAINE-EPINEPHRINE (PF) 2 %-1:200000 IJ SOLN
INTRAMUSCULAR | Status: DC | PRN
  Administered 2019-11-17 (×2): 10 mL via EPIDURAL

## 2019-11-17 MED ORDER — DIPHENHYDRAMINE HCL 25 MG PO CAPS: 25.0000 mg | ORAL_CAPSULE | Freq: Four times a day (QID) | ORAL | Status: DC | PRN

## 2019-11-17 MED ORDER — MEASLES, MUMPS & RUBELLA VAC IJ SOLR: 0.5000 mL | Freq: Once | INTRAMUSCULAR | Status: DC

## 2019-11-17 MED ORDER — EPHEDRINE 5 MG/ML INJ: 10.0000 mg | INTRAVENOUS | Status: DC | PRN

## 2019-11-17 MED ORDER — SODIUM CHLORIDE (PF) 0.9 % IJ SOLN
INTRAMUSCULAR | Status: DC | PRN
  Administered 2019-11-17: 12 mL/h via EPIDURAL

## 2019-11-17 MED ORDER — OXYTOCIN-SODIUM CHLORIDE 30-0.9 UT/500ML-% IV SOLN
1.0000 m[IU]/min | INTRAVENOUS | Status: DC
  Administered 2019-11-17: 12 m[IU]/min via INTRAVENOUS
  Administered 2019-11-17: 2 m[IU]/min via INTRAVENOUS
  Filled 2019-11-17: qty 500

## 2019-11-17 MED ORDER — SCOPOLAMINE 1 MG/3DAYS TD PT72
1.0000 | MEDICATED_PATCH | Freq: Once | TRANSDERMAL | Status: DC
  Administered 2019-11-17: 1.5 mg via TRANSDERMAL
  Filled 2019-11-17: qty 1

## 2019-11-17 MED ORDER — ACETAMINOPHEN 10 MG/ML IV SOLN: 1000.0000 mg | Freq: Once | INTRAVENOUS | Status: DC | PRN

## 2019-11-17 MED ORDER — OXYTOCIN-SODIUM CHLORIDE 30-0.9 UT/500ML-% IV SOLN
INTRAVENOUS | Status: DC | PRN
  Administered 2019-11-17: 30 [IU] via INTRAVENOUS

## 2019-11-17 MED ORDER — KETOROLAC TROMETHAMINE 30 MG/ML IJ SOLN: 30.0000 mg | Freq: Once | INTRAMUSCULAR | Status: DC | PRN

## 2019-11-17 MED ORDER — MORPHINE SULFATE (PF) 0.5 MG/ML IJ SOLN
INTRAMUSCULAR | Status: AC
  Filled 2019-11-17: qty 10

## 2019-11-17 MED ORDER — TRANEXAMIC ACID-NACL 1000-0.7 MG/100ML-% IV SOLN
INTRAVENOUS | Status: DC | PRN
  Administered 2019-11-17: 1000 mg via INTRAVENOUS

## 2019-11-17 MED ORDER — IBUPROFEN 800 MG PO TABS
800.0000 mg | ORAL_TABLET | Freq: Four times a day (QID) | ORAL | Status: DC
  Administered 2019-11-18 – 2019-11-19 (×5): 800 mg via ORAL
  Filled 2019-11-17 (×5): qty 1

## 2019-11-17 MED ORDER — SODIUM CHLORIDE 0.9 % IV SOLN
INTRAVENOUS | Status: AC
  Filled 2019-11-17: qty 500

## 2019-11-17 MED ORDER — ONDANSETRON HCL 4 MG/2ML IJ SOLN: 4.0000 mg | Freq: Four times a day (QID) | INTRAMUSCULAR | Status: DC | PRN

## 2019-11-17 MED ORDER — NALOXONE HCL 4 MG/10ML IJ SOLN
1.0000 ug/kg/h | INTRAVENOUS | Status: DC | PRN
  Filled 2019-11-17: qty 5

## 2019-11-17 MED ORDER — GABAPENTIN 100 MG PO CAPS
300.0000 mg | ORAL_CAPSULE | Freq: Two times a day (BID) | ORAL | Status: DC
  Filled 2019-11-17: qty 3

## 2019-11-17 MED ORDER — PRENATAL MULTIVITAMIN CH
1.0000 | ORAL_TABLET | Freq: Every day | ORAL | Status: DC
  Administered 2019-11-18 – 2019-11-19 (×2): 1 via ORAL
  Filled 2019-11-17 (×2): qty 1

## 2019-11-17 MED ORDER — COCONUT OIL OIL: 1.0000 "application " | TOPICAL_OIL | Status: DC | PRN

## 2019-11-17 MED ORDER — ACETAMINOPHEN 500 MG PO TABS
1000.0000 mg | ORAL_TABLET | Freq: Four times a day (QID) | ORAL | Status: AC
  Administered 2019-11-18 (×4): 1000 mg via ORAL
  Filled 2019-11-17 (×4): qty 2

## 2019-11-17 MED ORDER — OXYCODONE HCL 5 MG PO TABS: 5.0000 mg | ORAL_TABLET | ORAL | Status: DC | PRN

## 2019-11-17 MED ORDER — SODIUM CHLORIDE 0.9 % IV SOLN: INTRAVENOUS | Status: DC | PRN

## 2019-11-17 MED ORDER — OXYCODONE-ACETAMINOPHEN 5-325 MG PO TABS: 2.0000 | ORAL_TABLET | ORAL | Status: DC | PRN

## 2019-11-17 MED ORDER — FENTANYL-BUPIVACAINE-NACL 0.5-0.125-0.9 MG/250ML-% EP SOLN
12.0000 mL/h | EPIDURAL | Status: DC | PRN
  Filled 2019-11-17: qty 250

## 2019-11-17 MED ORDER — FENTANYL CITRATE (PF) 100 MCG/2ML IJ SOLN: 25.0000 ug | INTRAMUSCULAR | Status: DC | PRN

## 2019-11-17 MED ORDER — SENNOSIDES-DOCUSATE SODIUM 8.6-50 MG PO TABS
2.0000 | ORAL_TABLET | ORAL | Status: DC
  Administered 2019-11-18: 2 via ORAL
  Filled 2019-11-17: qty 2

## 2019-11-17 MED ORDER — ONDANSETRON HCL 4 MG/2ML IJ SOLN
4.0000 mg | Freq: Three times a day (TID) | INTRAMUSCULAR | Status: DC | PRN
  Administered 2019-11-17: 4 mg via INTRAVENOUS

## 2019-11-17 MED ORDER — PHENYLEPHRINE 40 MCG/ML (10ML) SYRINGE FOR IV PUSH (FOR BLOOD PRESSURE SUPPORT)
PREFILLED_SYRINGE | INTRAVENOUS | Status: AC
  Filled 2019-11-17: qty 10

## 2019-11-17 MED ORDER — FERROUS SULFATE 325 (65 FE) MG PO TABS
325.0000 mg | ORAL_TABLET | Freq: Two times a day (BID) | ORAL | Status: DC
  Administered 2019-11-18: 325 mg via ORAL
  Filled 2019-11-17: qty 1

## 2019-11-17 MED ORDER — ONDANSETRON HCL 4 MG/2ML IJ SOLN
INTRAMUSCULAR | Status: AC
  Filled 2019-11-17: qty 2

## 2019-11-17 MED ORDER — LACTATED RINGERS IV SOLN: INTRAVENOUS | Status: DC

## 2019-11-17 MED ORDER — LIDOCAINE HCL (PF) 1 % IJ SOLN: 30.0000 mL | INTRAMUSCULAR | Status: DC | PRN

## 2019-11-17 MED ORDER — TRANEXAMIC ACID-NACL 1000-0.7 MG/100ML-% IV SOLN
INTRAVENOUS | Status: AC
  Filled 2019-11-17: qty 100

## 2019-11-17 MED ORDER — SIMETHICONE 80 MG PO CHEW: 80.0000 mg | CHEWABLE_TABLET | ORAL | Status: DC | PRN

## 2019-11-17 MED ORDER — OXYCODONE-ACETAMINOPHEN 5-325 MG PO TABS: 1.0000 | ORAL_TABLET | ORAL | Status: DC | PRN

## 2019-11-17 MED ORDER — ENOXAPARIN SODIUM 40 MG/0.4ML ~~LOC~~ SOLN
40.0000 mg | SUBCUTANEOUS | Status: DC
  Administered 2019-11-18: 40 mg via SUBCUTANEOUS
  Filled 2019-11-17: qty 0.4

## 2019-11-17 MED ORDER — PHENYLEPHRINE 40 MCG/ML (10ML) SYRINGE FOR IV PUSH (FOR BLOOD PRESSURE SUPPORT)
80.0000 ug | PREFILLED_SYRINGE | INTRAVENOUS | Status: DC | PRN
  Filled 2019-11-17: qty 10

## 2019-11-17 MED ORDER — SODIUM CHLORIDE 0.9% FLUSH: 3.0000 mL | INTRAVENOUS | Status: DC | PRN

## 2019-11-17 MED ORDER — LACTATED RINGERS IV SOLN: 500.0000 mL | INTRAVENOUS | Status: DC | PRN

## 2019-11-17 MED ORDER — DIPHENHYDRAMINE HCL 50 MG/ML IJ SOLN: 12.5000 mg | INTRAMUSCULAR | Status: DC | PRN

## 2019-11-17 MED ORDER — HYDROMORPHONE HCL 1 MG/ML IJ SOLN: 1.0000 mg | INTRAMUSCULAR | Status: DC | PRN

## 2019-11-17 MED ORDER — LACTATED RINGERS IV SOLN
500.0000 mL | Freq: Once | INTRAVENOUS | Status: AC
  Administered 2019-11-17: 500 mL via INTRAVENOUS

## 2019-11-17 MED ORDER — OXYTOCIN BOLUS FROM INFUSION: 333.0000 mL | Freq: Once | INTRAVENOUS | Status: DC

## 2019-11-17 MED ORDER — ACETAMINOPHEN 325 MG PO TABS: 650.0000 mg | ORAL_TABLET | ORAL | Status: DC | PRN

## 2019-11-17 MED ORDER — ZOLPIDEM TARTRATE 5 MG PO TABS: 5.0000 mg | ORAL_TABLET | Freq: Every evening | ORAL | Status: DC | PRN

## 2019-11-17 MED ORDER — LACTATED RINGERS IV SOLN: INTRAVENOUS | Status: DC | PRN

## 2019-11-17 MED ORDER — SODIUM CHLORIDE 0.9 % IV SOLN
INTRAVENOUS | Status: DC | PRN
  Administered 2019-11-17: 500 mg via INTRAVENOUS

## 2019-11-17 MED ORDER — OXYTOCIN-SODIUM CHLORIDE 30-0.9 UT/500ML-% IV SOLN: 2.5000 [IU]/h | INTRAVENOUS | Status: DC

## 2019-11-17 MED ORDER — DEXAMETHASONE SODIUM PHOSPHATE 4 MG/ML IJ SOLN
INTRAMUSCULAR | Status: DC | PRN
  Administered 2019-11-17: 4 mg via INTRAVENOUS

## 2019-11-17 MED ORDER — WITCH HAZEL-GLYCERIN EX PADS: 1.0000 "application " | MEDICATED_PAD | CUTANEOUS | Status: DC | PRN

## 2019-11-17 MED ORDER — CEFAZOLIN SODIUM-DEXTROSE 2-4 GM/100ML-% IV SOLN
INTRAVENOUS | Status: AC
  Filled 2019-11-17: qty 100

## 2019-11-17 MED ORDER — ONDANSETRON HCL 4 MG/2ML IJ SOLN
INTRAMUSCULAR | Status: DC | PRN
  Administered 2019-11-17: 4 mg via INTRAVENOUS

## 2019-11-17 MED ORDER — NALBUPHINE HCL 10 MG/ML IJ SOLN: 5.0000 mg | INTRAMUSCULAR | Status: DC | PRN

## 2019-11-17 MED ORDER — KETOROLAC TROMETHAMINE 30 MG/ML IJ SOLN
30.0000 mg | Freq: Four times a day (QID) | INTRAMUSCULAR | Status: AC
  Administered 2019-11-18: 30 mg via INTRAVENOUS
  Filled 2019-11-17: qty 1

## 2019-11-17 MED ORDER — DIBUCAINE (PERIANAL) 1 % EX OINT: 1.0000 "application " | TOPICAL_OINTMENT | CUTANEOUS | Status: DC | PRN

## 2019-11-17 MED ORDER — NALOXONE HCL 0.4 MG/ML IJ SOLN: 0.4000 mg | INTRAMUSCULAR | Status: DC | PRN

## 2019-11-17 MED ORDER — MAGNESIUM HYDROXIDE 400 MG/5ML PO SUSP: 30.0000 mL | ORAL | Status: DC | PRN

## 2019-11-17 MED ORDER — CEFAZOLIN SODIUM-DEXTROSE 2-3 GM-%(50ML) IV SOLR
INTRAVENOUS | Status: DC | PRN
  Administered 2019-11-17: 2 g via INTRAVENOUS

## 2019-11-17 MED ORDER — DIPHENHYDRAMINE HCL 25 MG PO CAPS: 25.0000 mg | ORAL_CAPSULE | ORAL | Status: DC | PRN

## 2019-11-17 MED ORDER — PHENYLEPHRINE 40 MCG/ML (10ML) SYRINGE FOR IV PUSH (FOR BLOOD PRESSURE SUPPORT): 80.0000 ug | PREFILLED_SYRINGE | INTRAVENOUS | Status: DC | PRN

## 2019-11-17 MED ORDER — FENTANYL-BUPIVACAINE-NACL 0.5-0.125-0.9 MG/250ML-% EP SOLN: 12.0000 mL/h | EPIDURAL | Status: DC | PRN

## 2019-11-17 MED ORDER — TETANUS-DIPHTH-ACELL PERTUSSIS 5-2.5-18.5 LF-MCG/0.5 IM SUSP: 0.5000 mL | Freq: Once | INTRAMUSCULAR | Status: DC

## 2019-11-17 MED ORDER — NALBUPHINE HCL 10 MG/ML IJ SOLN: 5.0000 mg | Freq: Once | INTRAMUSCULAR | Status: DC | PRN

## 2019-11-17 MED ORDER — LACTATED RINGERS AMNIOINFUSION: INTRAVENOUS | Status: DC

## 2019-11-17 MED ORDER — TERBUTALINE SULFATE 1 MG/ML IJ SOLN
0.2500 mg | Freq: Once | INTRAMUSCULAR | Status: AC | PRN
  Administered 2019-11-17: 0.25 mg via SUBCUTANEOUS
  Filled 2019-11-17: qty 1

## 2019-11-17 MED ORDER — TRAMADOL HCL 50 MG PO TABS: 50.0000 mg | ORAL_TABLET | Freq: Four times a day (QID) | ORAL | Status: DC | PRN

## 2019-11-17 MED ORDER — FENTANYL CITRATE (PF) 100 MCG/2ML IJ SOLN
INTRAMUSCULAR | Status: DC | PRN
  Administered 2019-11-17 (×2): 50 ug via INTRAVENOUS

## 2019-11-17 MED ORDER — LIDOCAINE HCL (PF) 1 % IJ SOLN
INTRAMUSCULAR | Status: DC | PRN
  Administered 2019-11-17: 8 mL via EPIDURAL

## 2019-11-17 MED ORDER — SOD CITRATE-CITRIC ACID 500-334 MG/5ML PO SOLN
30.0000 mL | ORAL | Status: DC | PRN
  Filled 2019-11-17: qty 30

## 2019-11-17 MED ORDER — KETOROLAC TROMETHAMINE 30 MG/ML IJ SOLN: 30.0000 mg | Freq: Four times a day (QID) | INTRAMUSCULAR | Status: AC | PRN

## 2019-11-17 MED ORDER — FENTANYL CITRATE (PF) 100 MCG/2ML IJ SOLN
INTRAMUSCULAR | Status: AC
  Filled 2019-11-17: qty 2

## 2019-11-17 MED ORDER — PHENYLEPHRINE HCL (PRESSORS) 10 MG/ML IV SOLN
INTRAVENOUS | Status: DC | PRN
  Administered 2019-11-17: 200 ug via INTRAVENOUS

## 2019-11-17 MED ORDER — SIMETHICONE 80 MG PO CHEW
80.0000 mg | CHEWABLE_TABLET | ORAL | Status: DC
  Administered 2019-11-18: 80 mg via ORAL
  Filled 2019-11-17: qty 1

## 2019-11-17 SURGICAL SUPPLY — 35 items
BENZOIN TINCTURE PRP APPL 2/3 (GAUZE/BANDAGES/DRESSINGS) ×4 IMPLANT
CLAMP CORD UMBIL (MISCELLANEOUS) ×4 IMPLANT
CLOSURE WOUND 1/2 X4 (GAUZE/BANDAGES/DRESSINGS) ×1
CLOTH BEACON ORANGE TIMEOUT ST (SAFETY) ×4 IMPLANT
DRSG OPSITE POSTOP 4X10 (GAUZE/BANDAGES/DRESSINGS) ×4 IMPLANT
ELECT REM PT RETURN 9FT ADLT (ELECTROSURGICAL) ×4
ELECTRODE REM PT RTRN 9FT ADLT (ELECTROSURGICAL) ×2 IMPLANT
EXTRACTOR VACUUM M CUP 4 TUBE (SUCTIONS) IMPLANT
EXTRACTOR VACUUM M CUP 4' TUBE (SUCTIONS)
GAUZE SPONGE 4X4 12PLY STRL LF (GAUZE/BANDAGES/DRESSINGS) ×8 IMPLANT
GLOVE BIOGEL PI IND STRL 7.0 (GLOVE) ×6 IMPLANT
GLOVE BIOGEL PI INDICATOR 7.0 (GLOVE) ×6
GLOVE ECLIPSE 7.0 STRL STRAW (GLOVE) ×4 IMPLANT
GOWN STRL REUS W/TWL LRG LVL3 (GOWN DISPOSABLE) ×8 IMPLANT
KIT ABG SYR 3ML LUER SLIP (SYRINGE) ×4 IMPLANT
NEEDLE HYPO 22GX1.5 SAFETY (NEEDLE) ×4 IMPLANT
NEEDLE HYPO 25X5/8 SAFETYGLIDE (NEEDLE) ×4 IMPLANT
NS IRRIG 1000ML POUR BTL (IV SOLUTION) ×4 IMPLANT
PACK C SECTION WH (CUSTOM PROCEDURE TRAY) ×4 IMPLANT
PAD ABD 7.5X8 STRL (GAUZE/BANDAGES/DRESSINGS) ×8 IMPLANT
PAD OB MATERNITY 4.3X12.25 (PERSONAL CARE ITEMS) ×4 IMPLANT
PENCIL SMOKE EVAC W/HOLSTER (ELECTROSURGICAL) ×4 IMPLANT
RTRCTR C-SECT PINK 25CM LRG (MISCELLANEOUS) ×4 IMPLANT
STRIP CLOSURE SKIN 1/2X4 (GAUZE/BANDAGES/DRESSINGS) ×3 IMPLANT
SUT MNCRL 0 VIOLET CTX 36 (SUTURE) ×4 IMPLANT
SUT MONOCRYL 0 CTX 36 (SUTURE) ×4
SUT VIC AB 0 CTX 36 (SUTURE) ×2
SUT VIC AB 0 CTX36XBRD ANBCTRL (SUTURE) ×2 IMPLANT
SUT VIC AB 2-0 CT1 27 (SUTURE) ×4
SUT VIC AB 2-0 CT1 TAPERPNT 27 (SUTURE) ×4 IMPLANT
SUT VIC AB 4-0 KS 27 (SUTURE) ×4 IMPLANT
SYR 30ML LL (SYRINGE) ×4 IMPLANT
TOWEL OR 17X24 6PK STRL BLUE (TOWEL DISPOSABLE) ×4 IMPLANT
TRAY FOLEY W/BAG SLVR 14FR LF (SET/KITS/TRAYS/PACK) ×4 IMPLANT
WATER STERILE IRR 1000ML POUR (IV SOLUTION) ×8 IMPLANT

## 2019-11-17 NOTE — Anesthesia Preprocedure Evaluation (Addendum)
Anesthesia Evaluation  Patient identified by MRN, date of birth, ID band Patient awake    Reviewed: Allergy & Precautions, NPO status , Patient's Chart, lab work & pertinent test results  Airway Mallampati: I       Dental  (+) Teeth Intact, Dental Advisory Given   Pulmonary neg pulmonary ROS,    Pulmonary exam normal breath sounds clear to auscultation       Cardiovascular negative cardio ROS Normal cardiovascular exam Rhythm:Regular Rate:Normal     Neuro/Psych negative neurological ROS  negative psych ROS   GI/Hepatic negative GI ROS, Neg liver ROS,   Endo/Other  negative endocrine ROS  Renal/GU negative Renal ROS  negative genitourinary   Musculoskeletal negative musculoskeletal ROS (+)   Abdominal   Peds negative pediatric ROS (+)  Hematology negative hematology ROS (+)   Anesthesia Other Findings   Reproductive/Obstetrics negative OB ROS                            Anesthesia Physical Anesthesia Plan  ASA: III  Anesthesia Plan: Epidural   Post-op Pain Management:    Induction:   PONV Risk Score and Plan:   Airway Management Planned:   Additional Equipment:   Intra-op Plan:   Post-operative Plan:   Informed Consent: I have reviewed the patients History and Physical, chart, labs and discussed the procedure including the risks, benefits and alternatives for the proposed anesthesia with the patient or authorized representative who has indicated his/her understanding and acceptance.     Dental Advisory Given  Plan Discussed with: Anesthesiologist  Anesthesia Plan Comments: (Labs checked- platelets confirmed with RN in room. Fetal heart tracing, per RN, reported to be stable enough for sitting procedure. Discussed epidural, and patient consents to the procedure:  included risk of possible headache,backache, failed block, allergic reaction, and nerve injury. This patient  was asked if she had any questions or concerns before the procedure started.)        Anesthesia Quick Evaluation

## 2019-11-17 NOTE — Progress Notes (Addendum)
Erica Hahn is a 29 y.o. G3P2002 at [redacted]w[redacted]d admitted for elective post-term IOL  Subjective: Pt reports becoming uncomfortable, despite epidural; desires SVE  Objective: BP 130/88   Pulse 80   Temp 98.4 F (36.9 C) (Oral)   Resp 18   Ht 5\' 4"  (1.626 m)   Wt 89.5 kg   LMP 10/03/2018 (LMP Unknown)   SpO2 98%   BMI 33.88 kg/m  I/O last 3 completed shifts: In: -  Out: 700 [Urine:700] No intake/output data recorded.  FHT:  FHR: 110 bpm, variability: moderate,  accelerations:  Abscent,  decelerations:  Present earlies UC:   regular, every 2-3 minutes SVE:   Dilation: 6 Effacement (%): 80 Station: -2 Exam by:: [email or web address] CNM Student  Labs: Lab Results  Component Value Date   WBC 7.5 11/17/2019   HGB 13.4 11/17/2019   HCT 40.2 11/17/2019   MCV 86.8 11/17/2019   PLT 170 11/17/2019    Assessment / Plan: IOL due to post-term, progressing normally  RN to continue titration of pitocin Anticipate VBAC  Labor: Progressing normally Preeclampsia:  NA Fetal Wellbeing:  Category I Pain Control:  Epidural I/D:  GBSneg Anticipated MOD:  VBAC  11/19/2019 11/17/2019, 7:56 PM

## 2019-11-17 NOTE — Op Note (Signed)
Erica Hahn PROCEDURE DATE: 11/17/2019  PREOPERATIVE DIAGNOSES: Intrauterine pregnancy at [redacted]w[redacted]d weeks gestation; non-reassuring fetal status; undesired fertility  POSTOPERATIVE DIAGNOSES: The same  PROCEDURE: Repeat Low Transverse Cesarean Section, Bilateral Tubal Sterilization via Partial Salpingectomy  SURGEON:  Dr. Jaynie Collins  ASSISTANT: Dr. Merian Capron  ANESTHESIOLOGIST: Dr. Roxan Hockey  INDICATIONS: Erica Hahn is a 29 y.o. [id number] at [redacted]w[redacted]d here for emergent repeat cesarean section and bilateral tubal sterilization secondary to prolonged fetal heart rate decelerations. Code Cesarean was activated.  Patient was rapidly verbally consented, and taken to OR emergently.  In the OR, the heart rate was noted to improve so the case was downgraded to an urgent case.  Patient was insistent that she still wanted a bilateral tubal sterilization.     FINDINGS:  Viable female infant in cephalic presentation.  Apgars 8 and 9.  Clear amniotic fluid.  Intact placenta, three vessel cord.  Very thin lower uterine segment.  Normal uterus and bilateral ovaries. Fallopian tubes were enlarged and very long, also the mesosalpinges were rather engorged.  They were sterilized bilaterally via partial salpingectomy where the distal half of both tubes including the fimbriated ends were excised.  No significant intraperitoneal adhesive disease noted.  Cell Saver was used during this case, patient was able to get back 125 ml of RBCs.   ANESTHESIA: Epidural ESTIMATED BLOOD LOSS: 526 ml (125 ml was returned to patient via Cell Saver) SPECIMENS: Placenta and bilateral fallopian tube fragments also sent to pathology COMPLICATIONS: None immediate  PROCEDURE IN DETAIL:  The patient was urgently taken to the the operating room her epidural anesthesia was dosed up to surgical level and was found to be adequate. She was then placed in a dorsal supine position with a leftward tilt, prepped quickly with  betadine and draped in a sterile manner.  She already had a foley catheter in her bladder from L&D.  After a timeout was performed, a Pfannenstiel skin incision was made with scalpel over her preexisting scar and carried through to the underlying layer of fascia. The fascial incision was extended bilaterally in a blunt fashion.  The fascia was separated from underlying rectus muscles bluntly.  The rectus muscles were separated in the midline bluntly and the peritoneum was entered bluntly. Attention was turned to the lower uterine segment where a low transverse hysterotomy was made with a scalpel and extended bilaterally bluntly.  The infant was successfully delivered, the cord was clamped and cut and the infant was handed over to awaiting neonatology team. Uterine massage was then administered, and the placenta delivered intact with a three-vessel cord. The uterus was then cleared of clots and debris.  The hysterotomy was closed with 0 Vicryl in a running locked fashion, and an imbricating layer was also placed with 0 Vicryl.  Attention was then turned to the left fallopian tube. Kelly forceps were placed on the mesosalpinx underneath over 50% of the distal portion of the tube which was about 5 cm in length!  This pedicle was triply suture ligated with 2-0 Vicryl, and the tube including the fimbriated end was excised.  The distal end of the right fallopian tube was then identified, triply ligated, and was excised in a similar fashion allowing for bilateral tubal sterilization via bilateral partial salpingectomy.  Good hemostasis was noted overall. The pelvis was cleared of all clot and debris. Hemostasis was confirmed on all surfaces.  The retractor was removed.  The peritoneum was closed with a 0 Vicryl running stitch. The  fascia was then closed using 0 PDS in a running fashion.  The subcutaneous layer was irrigated, and the skin was closed with a 4-0 Vicryl subcuticular stitch. The patient tolerated the procedure  well. Sponge, instrument and needle counts were correct x 3.  She was taken to the recovery room in stable condition.    Jaynie Collins, MD, FACOG Obstetrician & Gynecologist, [facility] for [employer], [facility]

## 2019-11-17 NOTE — Progress Notes (Signed)
Faculty Practice OB/GYN Attending Note  Patient with two prolonged FHR decelerations back to back to 80s, not responsive to multiple intrauterine neonatal resuscitation maneuvers.   Emphasized need for cesarean section emergently, patient verbally consented. Code Cesarean called, patient taken to OR.   Jaynie Collins, MD, FACOG Obstetrician & Gynecologist, [facility] for [employer], [facility]

## 2019-11-17 NOTE — Anesthesia Preprocedure Evaluation (Deleted)
Anesthesia Evaluation  Patient identified by MRN, date of birth, ID band Patient awake    Reviewed: Allergy & Precautions, H&P , NPO status , Patient's Chart, lab work & pertinent test results, reviewed documented beta blocker date and time   Airway Mallampati: I  TM Distance: >3 FB Neck ROM: Full    Dental no notable dental hx. (+) Teeth Intact, Dental Advisory Given   Pulmonary    Pulmonary exam normal breath sounds clear to auscultation       Cardiovascular negative cardio ROS Normal cardiovascular exam Rhythm:Regular Rate:Normal     Neuro/Psych  Neuromuscular disease    GI/Hepatic   Endo/Other  Morbid obesity  Renal/GU   negative genitourinary   Musculoskeletal   Abdominal   Peds negative pediatric ROS (+)  Hematology   Anesthesia Other Findings   Reproductive/Obstetrics (+) Pregnancy                            Anesthesia Physical Anesthesia Plan  ASA: III  Anesthesia Plan: Epidural   Post-op Pain Management:    Induction:   PONV Risk Score and Plan:   Airway Management Planned:   Additional Equipment:   Intra-op Plan:   Post-operative Plan:   Informed Consent: I have reviewed the patients History and Physical, chart, labs and discussed the procedure including the risks, benefits and alternatives for the proposed anesthesia with the patient or authorized representative who has indicated his/her understanding and acceptance.     Dental Advisory Given  Plan Discussed with: Anesthesiologist  Anesthesia Plan Comments: (Labs checked- platelets confirmed with RN in room. Fetal heart tracing, per RN, reported to be stable enough for sitting procedure. Discussed epidural, and patient consents to the procedure:  included risk of possible headache,backache, failed block, allergic reaction, and nerve injury. This patient was asked if she had any questions or concerns before the  procedure started.   I have reviewed her films  4 view x-rays thoracic lumbar spine reviewed. This shows  instrumented thoracic fusion with multiple level pedicle screws fusion  from T4-L2. There is lumbar curve below the fusion. Hips and pelvis are  normal.  Scoliosis noted below the level of the fusion between L2 and S1.  I have had a long discussion with Erica Hahn in the presence of her husband regarding her analgesia options.   She is aware and understands, all issues and questions answered.   )       Anesthesia Quick Evaluation

## 2019-11-17 NOTE — Anesthesia Procedure Notes (Signed)
Epidural Patient location during procedure: OB Start time: 11/17/2019 2:35 PM End time: 11/17/2019 2:40 PM  Staffing Anesthesiologist: Bethena Midget, MD  Preanesthetic Checklist Completed: patient identified, IV checked, site marked, risks and benefits discussed, surgical consent, monitors and equipment checked, pre-op evaluation and timeout performed  Epidural Patient position: sitting Prep: DuraPrep and site prepped and draped Patient monitoring: continuous pulse ox and blood pressure Approach: midline Location: L5-S1 Injection technique: LOR air  Needle:  Needle type: Tuohy  Needle gauge: 17 G Needle length: 9 cm and 9 Needle insertion depth: 8 cm Catheter type: closed end flexible Catheter size: 19 Gauge Catheter at skin depth: 13 cm Test dose: negative  Assessment Events: blood not aspirated, injection not painful, no injection resistance, no paresthesia and negative IV test

## 2019-11-17 NOTE — Transfer of Care (Signed)
Immediate Anesthesia Transfer of Care Note  Patient: Erica Hahn  Procedure(s) Performed: CESAREAN SECTION (N/A ) APPLICATION OF CELL SAVER (N/A ) BILATERAL TUBAL LIGATION (Bilateral )  Patient Location: PACU  Anesthesia Type:Epidural  Level of Consciousness: awake  Airway & Oxygen Therapy: Patient Spontanous Breathing  Post-op Assessment: Report given to RN and Post -op Vital signs reviewed and stable  Post vital signs: Reviewed and stable  Last Vitals:  Vitals Value Taken Time  BP 127/85 11/17/19 2146  Temp    Pulse 92 11/17/19 2148  Resp 15 11/17/19 2148  SpO2 98 % 11/17/19 2148  Vitals shown include unvalidated device data.  Last Pain:  Vitals:   11/17/19 1900  TempSrc:   PainSc: 2          Complications: No complications documented.

## 2019-11-17 NOTE — Progress Notes (Signed)
Erica Hahn is a 29 y.o. G3P2002 at [redacted]w[redacted]d admitted for elective post-term IOL  Subjective: Pt sleeping, left lateral; comfortable w/ epidural  Objective: BP 125/78   Pulse 73   Temp 98.7 F (37.1 C) (Oral)   Resp 17   Ht 5\' 4"  (1.626 m)   Wt 89.5 kg   LMP 10/03/2018 (LMP Unknown)   SpO2 98%   BMI 33.88 kg/m  No intake/output data recorded. No intake/output data recorded.  FHT:  FHR: 115 bpm, variability: moderate,  accelerations:  Present,  decelerations:  Present occasional variables UC:   irregular, every 2-8 minutes SVE:   Dilation: 5 Effacement (%): 50 Station: -3 Exam by:: [email or web address], CNM Student  Labs: Lab Results  Component Value Date   WBC 7.5 11/17/2019   HGB 13.4 11/17/2019   HCT 40.2 11/17/2019   MCV 86.8 11/17/2019   PLT 170 11/17/2019    Assessment / Plan: Induction of labor due to post-term, progressing well on pitocin SROM during SVE; clear fluid noted, accels noted w/ scalp stim RN to continue titration of pitocin Anticipate VBAC  Labor: Progressing normally Preeclampsia:  NA Fetal Wellbeing:  overall Category I tracing, with occasional variables Pain Control:  Epidural I/D:  GBS neg Anticipated MOD:  VBAC  11/19/2019 11/17/2019, 5:06 PM

## 2019-11-17 NOTE — Consult Note (Signed)
Neonatology Note:   Attendance at C-section:    I was asked by Dr. Macon Large to attend this urgent C/S at term for fetal distress (originally a CODE C/s but baby HR improved and remained stable until delivery). The mother is a G3P2002, GBS neg with good prenatal care complicated by IOL for post dates.  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 ROM 3h 26m prior to delivery, fluid clear. Infant vigorous with good spontaneous cry and tone. Needed minimal bulb suctioning. +60 sec DCC.   Ap 8/9. Lungs clear to ausc in DR. Family updated.  To CN to care of Pediatrician.  Dineen Kid Leary Roca, MD

## 2019-11-17 NOTE — H&P (Signed)
LABOR AND DELIVERY ADMISSION HISTORY AND PHYSICAL NOTE  Erica Hahn is a 29 y.o. female G74P2002 with IUP at [redacted]w[redacted]d by [place] presenting for elective IOL.   She reports positive fetal movement. She denies leakage of fluid or vaginal bleeding.  Prenatal History/Complications: PNC at [facility] Pregnancy complications:  - Hx c-section for placental abruption (TOLAC consent completed)  - Hx sciatica  - Hx spinal fusion for scoliosis - Sickle cell trait  [place] 07/25/2019: Normal amniotic fluid, normal fetal anatomy, placenta anterior.   Nursing Staff Provider  Office Location  [facility] Dating  Early Ultrasound  Language  English Anatomy US  Normal with Limited Views; FU normal  Flu Vaccine  N/A Genetic Screen  NIPS: low risk  AFP: negative    TDaP vaccine   08/19/19 Hgb A1C or  GTT Early  Third trimester   Ref. Range 08/19/2019 08:30  Glucose, 1 hour Latest Ref Range: 65 - 179 mg/dL 361  Glucose, Fasting Latest Ref Range: 65 - 91 mg/dL 78  Glucose, 2 hour Latest Ref Range: 65 - 152 mg/dL 443    Rhogam  N/A   LAB RESULTS   Feeding Plan Breast-first 2 weeks then formula Blood Type O/Positive/-- (01/08 0934)   Contraception BTL signed 08/19/19 Antibody Negative (01/08 0934)  Circumcision If boy, yes Rubella 2.13 (01/08 0934)  Pediatrician  [facility] RPR Non Reactive (01/08 0934)   Support Person FOB-[facility] HBsAg Negative (01/08 0934)   Prenatal Classes No HIV Non Reactive (01/08 0934)  BTL Consent 08/19/19 GBS  (For PCN allergy, check sensitivities)   VBAC Consent 05/30/2019 Pap 2019 Normal    Hgb Electro  sickle cell trait (heterozygous) previous pregnancy  BP Cuff Rx sent to [facility] 04/19/19 CF Negative  Varicella Immune SMA Negative  Weight Scale Given 08/19/19 Waterbirth  [ ]  Class [ ]  Consent [ ]  CNM visit    Past Medical History: Past Medical History:  Diagnosis Date  . PID (acute pelvic inflammatory disease)   . Scoliosis     Past Surgical  History: Past Surgical History:  Procedure Laterality Date  . BACK SURGERY    . CESAREAN SECTION      Obstetrical History: OB History    Gravida  3   Para  2   Term  2   Preterm      AB  0   Living  2     SAB      TAB  0   Ectopic      Multiple      Live Births  2        Obstetric Comments  Has rods in spine and had to have spinal for delivery last time.          Social History: Social History   Socioeconomic History  . Marital status: Married    Spouse name: Not on file  . Number of children: 2  . Years of education: Not on file  . Highest education level: Some college, no degree  Occupational History  . Not on file  Tobacco Use  . Smoking status: Never Smoker  . Smokeless tobacco: Never Used  Vaping Use  . Vaping Use: Never used  Substance and Sexual Activity  . Alcohol use: Not Currently    Alcohol/week: 1.0 standard drink    Types: 1 Glasses of wine per week  . Drug use: No  . Sexual activity: Yes    Partners: Male    Birth control/protection: None  Other Topics Concern  . Not on file  Social History Narrative  . Not on file   Social Determinants of Health   Financial Resource Strain:   . Difficulty of Paying Living Expenses:   Food Insecurity:   . Worried About Programme researcher, broadcasting/film/video in the Last Year:   . Barista in the Last Year:   Transportation Needs:   . Freight forwarder (Medical):   Marland Kitchen Lack of Transportation (Non-Medical):   Physical Activity:   . Days of Exercise per Week:   . Minutes of Exercise per Session:   Stress:   . Feeling of Stress :   Social Connections:   . Frequency of Communication with Friends and Family:   . Frequency of Social Gatherings with Friends and Family:   . Attends Religious Services:   . Active Member of Clubs or Organizations:   . Attends Banker Meetings:   Marland Kitchen Marital Status:     Family History: Family History  Problem Relation Age of Onset  . Hypertension  Mother   . Hypertension Father     Allergies: No Known Allergies  Medications Prior to Admission  Medication Sig Dispense Refill Last Dose  . acetaminophen (TYLENOL) 500 MG tablet Take 500 mg by mouth every 6 (six) hours as needed.   Past Month at Unknown time  . Blood Pressure Monitoring (BLOOD PRESSURE MONITOR AUTOMAT) DEVI 1 Device by Does not apply route daily. Automatic blood pressure cuff regular size or large. Monitor blood pressure on a regularly bases at home. ICD-10 code: O09.90. 1 each 0 Past Week at Unknown time  . Misc. Devices (GOJJI WEIGHT SCALE) MISC 1 Device by Does not apply route daily as needed. Weight self daily as needed at home. ICD-10 code: O0.90 1 each 0 Past Week at Unknown time  . Prenatal Vit-Fe Fumarate-FA (MULTIVITAMIN-PRENATAL) 27-0.8 MG TABS tablet Take 1 tablet by mouth daily at 12 noon.   Past Week at Unknown time    Review of Systems  All systems reviewed and negative except as stated in HPI  Physical Exam Blood pressure 133/84, pulse 82, temperature 98.7 F (37.1 C), temperature source Oral, resp. rate 17, height 5\' 4"  (1.626 m), weight 89.5 kg, last menstrual period 10/03/2018. VS reviewed, nursing note reviewed,  Constitutional: well developed, well nourished, no distress HEENT: normocephalic CV: normal rate Pulm/chest wall: normal effort Abdomen: soft Neuro: alert and oriented x 3 Skin: warm, dry Psych: affect normal Dilation: Closed Effacement (%): Thick Exam by:: Leftwich-Kirby, CNM Dilation: Closed Effacement (%): Thick Exam by:: Leftwich-Kirby, CNM    Prenatal labs: ABO, Rh: --/--/O POS Performed at [facility], [address], [place], [place]  [phone]) Antibody: NEG (07/15 0750) Rubella: 2.13 (01/08 0934) RPR: Non Reactive (04/16 0830)  HBsAg: Negative (01/08 0934)  HIV: Non Reactive (04/16 0830)  GC/Chlamydia: Negative 10/21/19 GBS: Negative/-- (06/18 0950)  2-hr GTT: 147; 125 Genetic screening:  Low  risk cell free DNA Anatomy 01-23-1984: Normal  Prenatal Transfer Tool  Maternal Diabetes: No Genetic Screening: Normal Maternal Ultrasounds/Referrals: Normal Fetal Ultrasounds or other Referrals:  None Maternal Substance Abuse:  No Significant Maternal Medications:  None Significant Maternal Lab Results: Group B Strep negative  Results for orders placed or performed during the hospital encounter of 11/17/19 (from the past 24 hour(s))  CBC   Collection Time: 11/17/19  7:50 AM  Result Value Ref Range   WBC 7.5 4.0 - 10.5 K/uL  RBC 4.63 3.87 - 5.11 MIL/uL   Hemoglobin 13.4 12.0 - 15.0 g/dL   HCT 45.4 36 - 46 %   MCV 86.8 80.0 - 100.0 fL   MCH 28.9 26.0 - 34.0 pg   MCHC 33.3 30.0 - 36.0 g/dL   RDW 09.8 11.9 - 14.7 %   Platelets 170 150 - 400 K/uL   nRBC 0.0 0.0 - 0.2 %  Type and screen   Collection Time: 11/17/19  7:50 AM  Result Value Ref Range   ABO/RH(D) O POS    Antibody Screen NEG    Sample Expiration      11/20/2019,2359 Performed at [facility], [address], [place], [place] [address]   ABO/Rh   Collection Time: 11/17/19  8:44 AM  Result Value Ref Range   ABO/RH(D)      O POS Performed at [facility], [address] [place], [place] [address]   Results for orders placed or performed during the hospital encounter of 11/16/19 (from the past 24 hour(s))  SARS CORONAVIRUS 2 (TAT 6-24 HRS) Nasopharyngeal Nasopharyngeal Swab   Collection Time: 11/16/19  2:37 PM   Specimen: Nasopharyngeal Swab  Result Value Ref Range   SARS Coronavirus 2 NEGATIVE NEGATIVE    Patient Active Problem List   Diagnosis Date Noted  . Post-dates pregnancy 11/17/2019  . Sciatica of left side without back pain 09/23/2019  . History of C-section 05/13/2019  . History of placental abruption 05/13/2019  . Supervision of other normal pregnancy, antepartum 04/19/2019  . History of fusion of spine for scoliosis 11/02/2018  . S/P emergency C-section 07/29/2016     Assessment: LUPIE SAWA is a 29 y.o. G3P2002 at [redacted]w[redacted]d here for post term IOL. Hx complicated by previous c-section for placental abruption and previous spinal fusion due to scoliosis.   Admit for IOL Low dose pitocin for cervical ripening, foley bulb as appropriate Epidural early due to anesthesia and pt spinal surgery hx Anticipate VBAC  Sharen Counter, CNM 10:29 AM

## 2019-11-17 NOTE — Discharge Summary (Signed)
Postpartum Discharge Summary  Date of Service updated 11/19/19     Patient Name: Erica Hahn DOB: 1991/04/22 MRN: [id number]  Date of admission: 11/17/2019 Delivery date:11/17/2019  Delivering provider: Clarnce Flock  Date of discharge: 11/19/2019  Admitting diagnosis: Post-dates pregnancy [O48.0] Intrauterine pregnancy: [redacted]w[redacted]d    Secondary diagnosis:  Active Problems:   S/P emergency C-section   History of fusion of spine for scoliosis   Supervision of other normal pregnancy, antepartum   History of C-section   History of placental abruption   Post-dates pregnancy   Status post bilateral salpingectomy  Additional problems: Elevated BP during labor, no hx     Discharge diagnosis: Term Pregnancy Delivered                                              Post partum procedures:postpartum tubal ligation Augmentation: Pitocin and IP Foley Complications: None  Hospital course: Onset of Labor With Unplanned C/S   29y.o. yo G3P2002 at 458w5das admitted in Latent Labor on 11/17/2019. Patient had a labor course significant for: admitted for IOL for post dates. Initially induced with FB and pitocin and subsequently had SROM. Reached 6cm at which point she had a 9 min deceleration at which point a code cesarean was called. The patient went for cesarean section due to Non-Reassuring FHR. Delivery details as follows: on arrival to the OR FHR had recovered, however decision made to proceed with urgent RCS and bilateral salpingectomy.  Membrane Rupture Time/Date: 5:00 PM ,11/17/2019   Delivery Method:C-Section, Low Transverse  Details of operation can be found in separate operative note. Patient had an uncomplicated postpartum course.  She is ambulating,tolerating a regular diet, passing flatus, and urinating well.  Patient is discharged home in stable condition 11/19/19.  Newborn Data: Birth date:11/17/2019  Birth time:8:50 PM  Gender:Female  Living status:Living  Apgars:8 ,9   Weight:3560 g   Magnesium Sulfate received: No BMZ received: No Rhophylac:N/A MMR:N/A T-DaP:Given prenatally Flu: No Transfusion:No  Physical exam  Vitals:   11/18/19 0815 11/18/19 1240 11/18/19 2049 11/19/19 0525  BP: 128/83 120/88 133/84 128/90  Pulse: 84 80 86 (!) 59  Resp: '20 18 18 18  '$ Temp: 98.1 F (36.7 C) 98.3 F (36.8 C) 98.6 F (37 C) 98 F (36.7 C)  TempSrc: Oral Oral Oral Oral  SpO2: 97% 99% 99% 99%  Weight:      Height:       General: alert, cooperative and no distress Lochia: appropriate Uterine Fundus: firm Incision: Healing well with no significant drainage, Dressing is clean, dry, and intact DVT Evaluation: No evidence of DVT seen on physical exam. Negative Homan's sign. No cords or calf tenderness. No significant calf/ankle edema. Labs: Lab Results  Component Value Date   WBC 21.3 (H) 11/18/2019   HGB 13.2 11/18/2019   HCT 38.6 11/18/2019   MCV 86.9 11/18/2019   PLT 150 11/18/2019   CMP Latest Ref Rng & Units 11/18/2019  Glucose 70 - 99 mg/dL 127(H)  BUN 6 - 20 mg/dL 7  Creatinine 0.44 - 1.00 mg/dL 0.80  Sodium 135 - 145 mmol/L 135  Potassium 3.5 - 5.1 mmol/L 4.2  Chloride 98 - 111 mmol/L 109  CO2 22 - 32 mmol/L 18(L)  Calcium 8.9 - 10.3 mg/dL 8.9  Total Protein 6.5 - 8.1 g/dL 5.7(L)  Total Bilirubin 0.3 - 1.2 mg/dL  1.0  Alkaline Phos 38 - 126 U/L 96  AST 15 - 41 U/L 23  ALT 0 - 44 U/L 17   Edinburgh Score: Edinburgh Postnatal Depression Scale Screening Tool 11/18/2019  I have been able to laugh and see the funny side of things. 0  I have looked forward with enjoyment to things. 0  I have blamed myself unnecessarily when things went wrong. 1  I have been anxious or worried for no good reason. 0  I have felt scared or panicky for no good reason. 0  Things have been getting on top of me. 0  I have been so unhappy that I have had difficulty sleeping. 0  I have felt sad or miserable. 0  I have been so unhappy that I have been crying. 0   The thought of harming myself has occurred to me. 0  Edinburgh Postnatal Depression Scale Total 1     After visit meds:  Allergies as of 11/19/2019   No Known Allergies     Medication List    TAKE these medications   Blood Pressure Monitor Automat Devi 1 Device by Does not apply route daily. Automatic blood pressure cuff regular size or large. Monitor blood pressure on a regularly bases at home. ICD-10 code: O09.90.   Gojji Weight Scale Misc 1 Device by Does not apply route daily as needed. Weight self daily as needed at home. ICD-10 code: O0.90   ibuprofen 800 MG tablet Commonly known as: ADVIL Take 1 tablet (800 mg total) by mouth every 6 (six) hours.   multivitamin-prenatal 27-0.8 MG Tabs tablet Take 1 tablet by mouth daily at 12 noon.   oxyCODONE-acetaminophen 5-325 MG tablet Commonly known as: PERCOCET/ROXICET Take 2 tablets by mouth every 4 (four) hours as needed for severe pain ((when tolerating fluids)).        Discharge home in stable condition Infant Feeding: Bottle and Breast Infant Disposition:home with mother Discharge instruction: per After Visit Summary and Postpartum booklet. Activity: Advance as tolerated. Pelvic rest for 6 weeks.  Diet: routine diet Future Appointments: Future Appointments  Date Time Provider [place]  11/30/2019 10:50 AM Jorje Guild, NP [facility] None  12/30/2019 11:10 AM Gavin Pound, CNM [facility] None   Follow up Visit:  Follow-up Information    [facility]   Specialty: Obstetrics and Gynecology Why: In 1 week for BP/incision check, in 4-6 weeks for PP visit Contact information: [place] [phone]               Please schedule this patient for a Virtual postpartum visit in 6 weeks with the following provider: Any provider. Additional Postpartum F/U:Incision check 1 week  Low risk pregnancy complicated by: n/a Delivery  mode:  C-Section, Low Transverse  Anticipated Birth Control:  BTL done [facility]   11/19/2019 Fatima Blank, CNM

## 2019-11-17 NOTE — Progress Notes (Signed)
Erica Hahn is a 29 y.o. G3P2002 at [redacted]w[redacted]d by early ultrasound admitted for induction of labor due to postterm pregnancy.  Subjective: Pt breathing with contractions. S/O in room for support.   Objective: BP 122/68   Pulse 77   Temp 98.7 F (37.1 C) (Oral)   Resp 18   Ht 5\' 4"  (1.626 m)   Wt 89.5 kg   LMP 10/03/2018 (LMP Unknown)   BMI 33.88 kg/m  No intake/output data recorded. No intake/output data recorded.  FHT:  FHR: 120 bpm, variability: moderate,  accelerations:  Present,  decelerations:  Absent UC:   irregular, every 2-8 minutes SVE:   Dilation: 1.5 Effacement (%): Thick Station: -3 Exam by:: [email or web address], CNM Foley bulb placed without difficulty. RN filled balloon to 60 ml. Pt tolerated well.   Labs: Lab Results  Component Value Date   WBC 7.5 11/17/2019   HGB 13.4 11/17/2019   HCT 40.2 11/17/2019   MCV 86.8 11/17/2019   PLT 170 11/17/2019    Assessment / Plan: Induction of labor due to post term Pitocin for cervical ripening Foley balloon in place  Labor: Progressing normally Preeclampsia:  n/a Fetal Wellbeing:  Category I Pain Control:  Labor support without medications I/D:  GBS neg Anticipated MOD:  VBAC  Bruna Dills Leftwich-Kirby 11/17/2019, 2:00 PM

## 2019-11-18 LAB — CBC
HCT: 38.6 % (ref 36.0–46.0)
Hemoglobin: 13.2 g/dL (ref 12.0–15.0)
MCH: 29.7 pg (ref 26.0–34.0)
MCHC: 34.2 g/dL (ref 30.0–36.0)
MCV: 86.9 fL (ref 80.0–100.0)
Platelets: 150 10*3/uL (ref 150–400)
RBC: 4.44 MIL/uL (ref 3.87–5.11)
RDW: 13.2 % (ref 11.5–15.5)
WBC: 21.3 10*3/uL — ABNORMAL HIGH (ref 4.0–10.5)
nRBC: 0 % (ref 0.0–0.2)

## 2019-11-18 LAB — COMPREHENSIVE METABOLIC PANEL
ALT: 17 U/L (ref 0–44)
AST: 23 U/L (ref 15–41)
Albumin: 2.5 g/dL — ABNORMAL LOW (ref 3.5–5.0)
Alkaline Phosphatase: 96 U/L (ref 38–126)
Anion gap: 8 (ref 5–15)
BUN: 7 mg/dL (ref 6–20)
CO2: 18 mmol/L — ABNORMAL LOW (ref 22–32)
Calcium: 8.9 mg/dL (ref 8.9–10.3)
Chloride: 109 mmol/L (ref 98–111)
Creatinine, Ser: 0.8 mg/dL (ref 0.44–1.00)
GFR calc Af Amer: >60 mL/min (ref >60)
GFR calc non Af Amer: >60 mL/min (ref >60)
Glucose, Bld: 127 mg/dL — ABNORMAL HIGH (ref 70–99)
Potassium: 4.2 mmol/L (ref 3.5–5.1)
Sodium: 135 mmol/L (ref 135–145)
Total Bilirubin: 1 mg/dL (ref 0.3–1.2)
Total Protein: 5.7 g/dL — ABNORMAL LOW (ref 6.5–8.1)

## 2019-11-18 LAB — PROTEIN / CREATININE RATIO, URINE
Creatinine, Urine: 54.56 mg/dL
Total Protein, Urine: <6 mg/dL

## 2019-11-18 MED ORDER — PROMETHAZINE HCL 25 MG/ML IJ SOLN
12.5000 mg | INTRAMUSCULAR | Status: DC | PRN
  Administered 2019-11-18: 12.5 mg via INTRAVENOUS
  Filled 2019-11-18: qty 1

## 2019-11-18 NOTE — Progress Notes (Addendum)
POSTPARTUM PROGRESS NOTE  Subjective: Erica Hahn is a 29 y.o. G3P3003 POD#1 s/p rLTCS at [redacted]w[redacted]d for NRFHT.  She reports she doing well. Had several episodes of emesis overnight. She denies any problems with ambulating, voiding or po intake. Denies nausea or vomiting currently. She has not passed flatus. Pain is moderately controlled.  Lochia is appropriate.  Objective: Blood pressure 129/78, pulse 78, temperature 98.6 F (37 C), temperature source Oral, resp. rate 18, height 5\' 4"  (1.626 m), weight 89.5 kg, last menstrual period 10/03/2018, SpO2 97 %, unknown if currently breastfeeding.  Physical Exam:  General: alert, cooperative and no distress Chest: no respiratory distress Abdomen: soft, non-tender, incision covered with no clean dressing  Uterine Fundus: firm, appropriately tender Extremities: No calf swelling or tenderness  no edema  Recent Labs    11/17/19 0750 11/18/19 0532  HGB 13.4 13.2  HCT 40.2 38.6    Assessment/Plan: Erica Hahn is a 29 y.o. G3P3003 s/p rLTCS at [redacted]w[redacted]d for Post-term IOL with urgent section due to NRFHT.  Routine Postpartum Care: Doing well, pain well-controlled.  -- Continue routine care, lactation support  -- Contraception: BTL -- Feeding: Breast  Dispo: Plan for discharge possibly tomorrow.  [redacted]w[redacted]d, DO PGY1 Family Medicine Resident  GME ATTESTATION:  I saw and evaluated the patient. I agree with the findings and the plan of care as documented in the resident's note with addition of the following:  Desires outpatient circ for her son. BPs slightly elevated after delivery, one normotensive this AM. Will monitor, and if BP elevated this afternoon will start BP med  Evelena Leyden, DO OB Fellow, Faculty Practice [facility], [facility] 11/18/2019 9:01 AM

## 2019-11-18 NOTE — Anesthesia Postprocedure Evaluation (Signed)
Anesthesia Post Note  Patient: SHALI VESEY  Procedure(s) Performed: CESAREAN SECTION (N/A ) APPLICATION OF CELL SAVER (N/A ) BILATERAL TUBAL LIGATION (Bilateral )     Patient location during evaluation: PACU Anesthesia Type: Epidural Level of consciousness: oriented and awake and alert Pain management: pain level controlled Vital Signs Assessment: post-procedure vital signs reviewed and stable Respiratory status: spontaneous breathing, respiratory function stable and patient connected to nasal cannula oxygen Cardiovascular status: blood pressure returned to baseline and stable Postop Assessment: no headache, no backache, no apparent nausea or vomiting and epidural receding Anesthetic complications: no   No complications documented.  Last Vitals:  Vitals:   11/18/19 0130 11/18/19 0240  BP: (!) 141/88 129/78  Pulse: 85 78  Resp: 20 20  Temp: 36.6 C   SpO2: 96% 96%    Last Pain:  Vitals:   11/18/19 0315  TempSrc:   PainSc: 0-No pain   Pain Goal:                   Khamauri Bauernfeind L Sheneka Schrom

## 2019-11-18 NOTE — Progress Notes (Signed)
Pt not given oral medication at this time due to ongoing emesis episodes; pain currently controlled and VS WNL. MD aware of continued emesis and order for antiemetic given.

## 2019-11-19 ENCOUNTER — Encounter (HOSPITAL_COMMUNITY): Payer: Self-pay | Admitting: Obstetrics & Gynecology

## 2019-11-19 MED ORDER — OXYCODONE-ACETAMINOPHEN 5-325 MG PO TABS: 2.0000 | ORAL_TABLET | ORAL | 0 refills | Status: DC | PRN

## 2019-11-19 MED ORDER — IBUPROFEN 800 MG PO TABS: 800.0000 mg | ORAL_TABLET | Freq: Four times a day (QID) | ORAL | 0 refills | Status: DC

## 2019-11-21 ENCOUNTER — Telehealth: Payer: Self-pay | Admitting: *Deleted

## 2019-11-21 ENCOUNTER — Inpatient Hospital Stay (HOSPITAL_COMMUNITY)
Admission: AD | Admit: 2019-11-21 | Discharge: 2019-11-23 | DRG: 776 | Disposition: A | Discharge status: Home / Self Care | Payer: Medicaid Other | Attending: Obstetrics & Gynecology | Admitting: Obstetrics & Gynecology

## 2019-11-21 ENCOUNTER — Other Ambulatory Visit: Payer: Self-pay

## 2019-11-21 ENCOUNTER — Encounter (HOSPITAL_COMMUNITY): Payer: Self-pay | Admitting: Obstetrics & Gynecology

## 2019-11-21 DIAGNOSIS — Z8759 Personal history of other complications of pregnancy, childbirth and the puerperium: Secondary | ICD-10-CM

## 2019-11-21 DIAGNOSIS — O1495 Unspecified pre-eclampsia, complicating the puerperium: Secondary | ICD-10-CM

## 2019-11-21 DIAGNOSIS — O1420 HELLP syndrome (HELLP), unspecified trimester: Secondary | ICD-10-CM | POA: Diagnosis present

## 2019-11-21 DIAGNOSIS — O1425 HELLP syndrome, complicating the puerperium: Principal | ICD-10-CM | POA: Diagnosis present

## 2019-11-21 DIAGNOSIS — O8620 Urinary tract infection following delivery, unspecified: Secondary | ICD-10-CM | POA: Diagnosis present

## 2019-11-21 DIAGNOSIS — R310 Gross hematuria: Secondary | ICD-10-CM

## 2019-11-21 LAB — URINALYSIS, MICROSCOPIC (REFLEX): RBC / HPF: >50 RBC/hpf (ref 0–5)

## 2019-11-21 LAB — URINALYSIS, ROUTINE W REFLEX MICROSCOPIC
Bilirubin Urine: NEGATIVE
Glucose, UA: 100 mg/dL — AB
Ketones, ur: NEGATIVE mg/dL
Nitrite: POSITIVE — AB
Protein, ur: >300 mg/dL — AB
Specific Gravity, Urine: 1.01 (ref 1.005–1.030)
pH: 7.5 (ref 5.0–8.0)

## 2019-11-21 LAB — COMPREHENSIVE METABOLIC PANEL
ALT: 127 U/L — ABNORMAL HIGH (ref 0–44)
AST: 102 U/L — ABNORMAL HIGH (ref 15–41)
Albumin: 2.8 g/dL — ABNORMAL LOW (ref 3.5–5.0)
Alkaline Phosphatase: 84 U/L (ref 38–126)
Anion gap: 7 (ref 5–15)
BUN: 9 mg/dL (ref 6–20)
CO2: 23 mmol/L (ref 22–32)
Calcium: 8.8 mg/dL — ABNORMAL LOW (ref 8.9–10.3)
Chloride: 111 mmol/L (ref 98–111)
Creatinine, Ser: 0.97 mg/dL (ref 0.44–1.00)
GFR calc Af Amer: >60 mL/min (ref >60)
GFR calc non Af Amer: >60 mL/min (ref >60)
Glucose, Bld: 96 mg/dL (ref 70–99)
Potassium: 3.8 mmol/L (ref 3.5–5.1)
Sodium: 141 mmol/L (ref 135–145)
Total Bilirubin: 0.8 mg/dL (ref 0.3–1.2)
Total Protein: 6.3 g/dL — ABNORMAL LOW (ref 6.5–8.1)

## 2019-11-21 LAB — CBC
HCT: 34.9 % — ABNORMAL LOW (ref 36.0–46.0)
Hemoglobin: 11.9 g/dL — ABNORMAL LOW (ref 12.0–15.0)
MCH: 29.6 pg (ref 26.0–34.0)
MCHC: 34.1 g/dL (ref 30.0–36.0)
MCV: 86.8 fL (ref 80.0–100.0)
Platelets: 196 10*3/uL (ref 150–400)
RBC: 4.02 MIL/uL (ref 3.87–5.11)
RDW: 13.3 % (ref 11.5–15.5)
WBC: 9.3 10*3/uL (ref 4.0–10.5)
nRBC: 0 % (ref 0.0–0.2)

## 2019-11-21 LAB — SURGICAL PATHOLOGY

## 2019-11-21 NOTE — Telephone Encounter (Signed)
Patient called stating she is having bloody urine. Patient is s/p c-section 11/17/19. Advised patient that if she is having bloody urine she need to be seen at MAU.  Clovis Pu, RN

## 2019-11-21 NOTE — Telephone Encounter (Signed)
°  Transition Care Management Follow-up Telephone Call   [facility] Managed Care Transition Call Status:MM [facility] Call Made   Date of discharge and from where: [facility], 11/19/19   How have you been since you were released from the hospital? "? Blood in urine" since 11/20/19; she is out of town and trying to [place]; also see telephone encounter dated 11/21/19   Any questions or concerns?  "? Blood in urine" since 11/20/19  Items Reviewed:  Did the pt receive and understand the discharge instructions provided? Yes   Medications obtained and verified? Yes   Any new allergies since your discharge? No   Dietary orders reviewed? No  Do you have support at home? Yes  Functional Questionnaire: (I = Independent and D = Dependent)  ADLs: Independent Bathing/Dressing:Independent Meal Prep: Independent Eating: Independent Maintaining continence: Dependent problem since  Transferring/Ambulation: Independent Managing Meds: Independent Follow up appointments reviewed:  PCP Hospital f/u appt confirmed? No; pt states she was not advised to see her PCP; pt advised to schedule follow up appt with PCP  Specialist Hospital f/u appt confirmed?  Scheduled to see Judeth Horn, CHW [facility]  on 11/30/19 @ 1050; and Clyde Lundborg, CH[facility] on 12/30/19 at 1110 . Are transportation arrangements needed? no  If their condition worsens, is the pt aware to call PCP or go to the EmergencyDept.? Yes  Was the patient provided with contact information for the PCP's office or ED? yes  Was to pt encouraged to call back with questions or concerns? yes

## 2019-11-21 NOTE — MAU Note (Signed)
Patient states she delivered last Thursday but states since her d/c on Saturday she notices more blood when she pees than what she notices just on her pad.  Also reports a little bit of burning when she voids.  Denies lower back pain.  States she is continuing to have a light amount of vaginal bleeding.

## 2019-11-22 DIAGNOSIS — O1420 HELLP syndrome (HELLP), unspecified trimester: Secondary | ICD-10-CM | POA: Diagnosis present

## 2019-11-22 DIAGNOSIS — O8620 Urinary tract infection following delivery, unspecified: Secondary | ICD-10-CM | POA: Diagnosis present

## 2019-11-22 DIAGNOSIS — O1425 HELLP syndrome, complicating the puerperium: Secondary | ICD-10-CM | POA: Diagnosis present

## 2019-11-22 DIAGNOSIS — R319 Hematuria, unspecified: Secondary | ICD-10-CM | POA: Diagnosis not present

## 2019-11-22 DIAGNOSIS — Z98891 History of uterine scar from previous surgery: Secondary | ICD-10-CM | POA: Diagnosis not present

## 2019-11-22 LAB — COMPREHENSIVE METABOLIC PANEL
ALT: 108 U/L — ABNORMAL HIGH (ref 0–44)
AST: 73 U/L — ABNORMAL HIGH (ref 15–41)
Albumin: 2.6 g/dL — ABNORMAL LOW (ref 3.5–5.0)
Alkaline Phosphatase: 79 U/L (ref 38–126)
Anion gap: 8 (ref 5–15)
BUN: 8 mg/dL (ref 6–20)
CO2: 21 mmol/L — ABNORMAL LOW (ref 22–32)
Calcium: 8.4 mg/dL — ABNORMAL LOW (ref 8.9–10.3)
Chloride: 110 mmol/L (ref 98–111)
Creatinine, Ser: 1.02 mg/dL — ABNORMAL HIGH (ref 0.44–1.00)
GFR calc Af Amer: >60 mL/min (ref >60)
GFR calc non Af Amer: >60 mL/min (ref >60)
Glucose, Bld: 155 mg/dL — ABNORMAL HIGH (ref 70–99)
Potassium: 3.3 mmol/L — ABNORMAL LOW (ref 3.5–5.1)
Sodium: 139 mmol/L (ref 135–145)
Total Bilirubin: 0.7 mg/dL (ref 0.3–1.2)
Total Protein: 5.7 g/dL — ABNORMAL LOW (ref 6.5–8.1)

## 2019-11-22 LAB — URINE CULTURE: Culture: NO GROWTH

## 2019-11-22 LAB — CBC
HCT: 32.8 % — ABNORMAL LOW (ref 36.0–46.0)
Hemoglobin: 11.2 g/dL — ABNORMAL LOW (ref 12.0–15.0)
MCH: 29.9 pg (ref 26.0–34.0)
MCHC: 34.1 g/dL (ref 30.0–36.0)
MCV: 87.7 fL (ref 80.0–100.0)
Platelets: 166 10*3/uL (ref 150–400)
RBC: 3.74 MIL/uL — ABNORMAL LOW (ref 3.87–5.11)
RDW: 13.3 % (ref 11.5–15.5)
WBC: 8.2 10*3/uL (ref 4.0–10.5)
nRBC: 0 % (ref 0.0–0.2)

## 2019-11-22 MED ORDER — PRENATAL 27-0.8 MG PO TABS: 1.0000 | ORAL_TABLET | Freq: Every day | ORAL | Status: DC

## 2019-11-22 MED ORDER — MAGNESIUM SULFATE BOLUS VIA INFUSION
4.0000 g | Freq: Once | INTRAVENOUS | Status: AC
  Administered 2019-11-22: 4 g via INTRAVENOUS
  Filled 2019-11-22: qty 1000

## 2019-11-22 MED ORDER — LABETALOL HCL 5 MG/ML IV SOLN: 20.0000 mg | INTRAVENOUS | Status: DC | PRN

## 2019-11-22 MED ORDER — HYDRALAZINE HCL 20 MG/ML IJ SOLN: 10.0000 mg | INTRAMUSCULAR | Status: DC | PRN

## 2019-11-22 MED ORDER — OXYCODONE-ACETAMINOPHEN 5-325 MG PO TABS
2.0000 | ORAL_TABLET | ORAL | Status: DC | PRN
  Administered 2019-11-23: 2 via ORAL
  Filled 2019-11-22: qty 2

## 2019-11-22 MED ORDER — LABETALOL HCL 5 MG/ML IV SOLN: 80.0000 mg | INTRAVENOUS | Status: DC | PRN

## 2019-11-22 MED ORDER — IBUPROFEN 800 MG PO TABS
800.0000 mg | ORAL_TABLET | Freq: Four times a day (QID) | ORAL | Status: DC
  Administered 2019-11-22 – 2019-11-23 (×7): 800 mg via ORAL
  Filled 2019-11-22 (×8): qty 1

## 2019-11-22 MED ORDER — CEPHALEXIN 500 MG PO CAPS
500.0000 mg | ORAL_CAPSULE | Freq: Three times a day (TID) | ORAL | Status: DC
  Administered 2019-11-22 – 2019-11-23 (×3): 500 mg via ORAL
  Filled 2019-11-22 (×3): qty 1

## 2019-11-22 MED ORDER — PRENATAL MULTIVITAMIN CH
1.0000 | ORAL_TABLET | Freq: Every day | ORAL | Status: DC
  Administered 2019-11-22 – 2019-11-23 (×2): 1 via ORAL
  Filled 2019-11-22 (×2): qty 1

## 2019-11-22 MED ORDER — LABETALOL HCL 5 MG/ML IV SOLN: 40.0000 mg | INTRAVENOUS | Status: DC | PRN

## 2019-11-22 MED ORDER — NIFEDIPINE ER OSMOTIC RELEASE 30 MG PO TB24
30.0000 mg | ORAL_TABLET | Freq: Every day | ORAL | Status: DC
  Administered 2019-11-22 – 2019-11-23 (×2): 30 mg via ORAL
  Filled 2019-11-22 (×2): qty 1

## 2019-11-22 MED ORDER — MAGNESIUM SULFATE 40 GM/1000ML IV SOLN
2.0000 g/h | INTRAVENOUS | Status: AC
  Administered 2019-11-22 (×2): 2 g/h via INTRAVENOUS
  Filled 2019-11-22 (×3): qty 1000

## 2019-11-22 MED ORDER — LACTATED RINGERS IV SOLN: INTRAVENOUS | Status: DC

## 2019-11-22 MED ORDER — SODIUM CHLORIDE 0.9 % IV SOLN
2.0000 g | Freq: Once | INTRAVENOUS | Status: AC
  Administered 2019-11-22: 2 g via INTRAVENOUS
  Filled 2019-11-22: qty 20

## 2019-11-22 NOTE — Progress Notes (Signed)
Subjective: Postpartum Day 5: Cesarean Delivery Patient reports no CNS complaints Otherwise no complaints except hematuria.    Objective: Vital signs in last 24 hours: Temp:  [97.6 F (36.4 C)-99 F (37.2 C)] 97.6 F (36.4 C) (07/20 0700) Pulse Rate:  [60-95] 67 (07/20 0700) Resp:  [17-19] 18 (07/20 0700) BP: (128-146)/(68-99) 133/88 (07/20 0700) SpO2:  [98 %-99 %] 98 % (07/20 0606)  Physical Exam:  General: alert, cooperative and no distress Lochia: appropriate Uterine Fundus: firm Incision: healing well DVT Evaluation: No evidence of DVT seen on physical exam.  Recent Labs    11/21/19 2241 11/22/19 0437  HGB 11.9* 11.2*  HCT 34.9* 32.8*    Assessment/Plan: PP HELLP: on magnesium for 24 hours, procardia xl 30 daily begun with good control, recheck LFTs(improved a touch from admission  UTI: culture pending, rocephin 2 grams IV given as initial treatment and will continue keflex to complete 10 days, w/the history of a catheter post operatively, check se3nsitivities  Status post Cesarean section. Doing well postoperatively.   Anticipate discharge tomorrow.  Lazaro Arms 11/22/2019, 7:42 AM

## 2019-11-22 NOTE — H&P (Signed)
OBSTETRIC ADMISSION HISTORY AND PHYSICAL  Erica Hahn is a 29 y.o. female 29 y.o. G3P3 s/p repeat CS 4 days ago presenting with hematuria. First noticed it about 2 days ago. Reports dysuria but no other urinary sx. Her BP was elevated upon intake. No hx of HTN. Denies HA, visual disturbances, RUQ pain, SOB, and CP. She is breastfeeding.   Prenatal History/Complications: - previous CS - sickle cell trait  Past Medical History: Past Medical History:  Diagnosis Date  . PID (acute pelvic inflammatory disease)   . Scoliosis     Past Surgical History: Past Surgical History:  Procedure Laterality Date  . BACK SURGERY    . CESAREAN SECTION    . CESAREAN SECTION N/A 11/17/2019   Procedure: CESAREAN SECTION;  Surgeon: Tereso Newcomer, MD;  Location: [facility];  Service: Obstetrics;  Laterality: N/A;  . TUBAL LIGATION Bilateral 11/17/2019   Procedure: BILATERAL TUBAL LIGATION;  Surgeon: Tereso Newcomer, MD;  Location: [facility];  Service: Obstetrics;  Laterality: Bilateral;    Obstetrical History: OB History    Gravida  3   Para  3   Term  3   Preterm      AB  0   Living  3     SAB      TAB  0   Ectopic      Multiple  0   Live Births  3        Obstetric Comments  Has rods in spine and had to have spinal for delivery last time.          Social History: Social History   Socioeconomic History  . Marital status: Married    Spouse name: Not on file  . Number of children: 2  . Years of education: Not on file  . Highest education level: Some college, no degree  Occupational History  . Not on file  Tobacco Use  . Smoking status: Never Smoker  . Smokeless tobacco: Never Used  Vaping Use  . Vaping Use: Never used  Substance and Sexual Activity  . Alcohol use: Not Currently    Alcohol/week: 1.0 standard drink    Types: 1 Glasses of wine per week  . Drug use: No  . Sexual activity: Not Currently    Partners: Male    Birth control/protection: None   Other Topics Concern  . Not on file  Social History Narrative  . Not on file   Social Determinants of Health   Financial Resource Strain:   . Difficulty of Paying Living Expenses:   Food Insecurity:   . Worried About Programme researcher, broadcasting/film/video in the Last Year:   . Barista in the Last Year:   Transportation Needs:   . Freight forwarder (Medical):   Marland Kitchen Lack of Transportation (Non-Medical):   Physical Activity:   . Days of Exercise per Week:   . Minutes of Exercise per Session:   Stress:   . Feeling of Stress :   Social Connections:   . Frequency of Communication with Friends and Family:   . Frequency of Social Gatherings with Friends and Family:   . Attends Religious Services:   . Active Member of Clubs or Organizations:   . Attends Banker Meetings:   Marland Kitchen Marital Status:     Family History: Family History  Problem Relation Age of Onset  . Hypertension Mother   . Hypertension Father     Allergies: No  Known Allergies  Medications Prior to Admission  Medication Sig Dispense Refill Last Dose  . ibuprofen (ADVIL) 800 MG tablet Take 1 tablet (800 mg total) by mouth every 6 (six) hours. 30 tablet 0 11/20/2019 at Unknown time  . Blood Pressure Monitoring (BLOOD PRESSURE MONITOR AUTOMAT) DEVI 1 Device by Does not apply route daily. Automatic blood pressure cuff regular size or large. Monitor blood pressure on a regularly bases at home. ICD-10 code: O09.90. 1 each 0   . Misc. Devices (GOJJI WEIGHT SCALE) MISC 1 Device by Does not apply route daily as needed. Weight self daily as needed at home. ICD-10 code: O0.90 1 each 0   . oxyCODONE-acetaminophen (PERCOCET/ROXICET) 5-325 MG tablet Take 2 tablets by mouth every 4 (four) hours as needed for severe pain ((when tolerating fluids)). 20 tablet 0   . Prenatal Vit-Fe Fumarate-FA (MULTIVITAMIN-PRENATAL) 27-0.8 MG TABS tablet Take 1 tablet by mouth daily at 12 noon.        Review of Systems:  All systems reviewed  and negative except as stated in HPI  PE: Blood pressure 132/89, pulse 95, temperature 99 F (37.2 C), resp. rate 19, unknown if currently breastfeeding.  Patient Vitals for the past 24 hrs:  BP Temp Pulse Resp  11/22/19 0001 132/89 -- 95 --  11/21/19 2346 132/68 -- 72 --  11/21/19 2331 138/87 -- 69 --  11/21/19 2316 (!) 139/93 -- 76 --  11/21/19 2301 132/87 -- 72 --  11/21/19 2256 129/85 -- 78 --  11/21/19 2241 (!) 144/91 -- 79 --  11/21/19 2204 138/86 -- 78 --  11/21/19 2151 129/84 -- 83 --  11/21/19 2136 (!) 142/82 99 F (37.2 C) 87 19   General appearance: alert, cooperative and no distress Lungs: regular rate and effort Heart: regular rate  Extremities: Homans sign is negative, no sign of DVT  Prenatal labs: ABO, Rh: --/--/O POS Performed at [facility], [address], [place], [place] [address]  [id number]) Antibody: NEG (07/15 0750) Rubella: 2.13 (01/08 0934) RPR: NON REACTIVE (07/15 0750)  HBsAg: Negative (01/08 0934)  HIV: Non Reactive (04/16 0830)  GBS: Negative/-- (06/18 0950)    Results for orders placed or performed during the hospital encounter of 11/21/19 (from the past 24 hour(s))  CBC   Collection Time: 11/21/19 10:41 PM  Result Value Ref Range   WBC 9.3 4.0 - 10.5 K/uL   RBC 4.02 3.87 - 5.11 MIL/uL   Hemoglobin 11.9 (L) 12.0 - 15.0 g/dL   HCT 83.3 (L) 36 - 46 %   MCV 86.8 80.0 - 100.0 fL   MCH 29.6 26.0 - 34.0 pg   MCHC 34.1 30.0 - 36.0 g/dL   RDW 82.5 05.3 - 97.6 %   Platelets 196 150 - 400 K/uL   nRBC 0.0 0.0 - 0.2 %  Comprehensive metabolic panel   Collection Time: 11/21/19 10:41 PM  Result Value Ref Range   Sodium 141 135 - 145 mmol/L   Potassium 3.8 3.5 - 5.1 mmol/L   Chloride 111 98 - 111 mmol/L   CO2 23 22 - 32 mmol/L   Glucose, Bld 96 70 - 99 mg/dL   BUN 9 6 - 20 mg/dL   Creatinine, Ser 7.34 0.44 - 1.00 mg/dL   Calcium 8.8 (L) 8.9 - 10.3 mg/dL   Total Protein 6.3 (L) 6.5 - 8.1 g/dL   Albumin 2.8 (L) 3.5 - 5.0 g/dL   AST  193 (H) 15 - 41 U/L  ALT 127 (H) 0 - 44 U/L   Alkaline Phosphatase 84 38 - 126 U/L   Total Bilirubin 0.8 0.3 - 1.2 mg/dL   GFR calc non Af Amer >60 >60 mL/min   GFR calc Af Amer >60 >60 mL/min   Anion gap 7 5 - 15  Urinalysis, Routine w reflex microscopic   Collection Time: 11/21/19 11:11 PM  Result Value Ref Range   Color, Urine RED (A) YELLOW   APPearance TURBID (A) CLEAR   Specific Gravity, Urine 1.010 1.005 - 1.030   pH 7.5 5.0 - 8.0   Glucose, UA 100 (A) NEGATIVE mg/dL   Hgb urine dipstick LARGE (A) NEGATIVE   Bilirubin Urine NEGATIVE NEGATIVE   Ketones, ur NEGATIVE NEGATIVE mg/dL   Protein, ur >170 (A) NEGATIVE mg/dL   Nitrite POSITIVE (A) NEGATIVE   Leukocytes,Ua SMALL (A) NEGATIVE  Urinalysis, Microscopic (reflex)   Collection Time: 11/21/19 11:11 PM  Result Value Ref Range   RBC / HPF >50 0 - 5 RBC/hpf   WBC, UA 6-10 0 - 5 WBC/hpf   Bacteria, UA FEW (A) NONE SEEN   Squamous Epithelial / LPF 0-5 0 - 5    Patient Active Problem List   Diagnosis Date Noted  . HELLP (hemolytic anemia/elev liver enzymes/low platelets in pregnancy), unspecified trimester 11/22/2019  . Post-dates pregnancy 11/17/2019  . Status post bilateral salpingectomy 11/17/2019  . Sciatica of left side without back pain 09/23/2019  . History of C-section 05/13/2019  . History of placental abruption 05/13/2019  . Supervision of other normal pregnancy, antepartum 04/19/2019  . History of fusion of spine for scoliosis 11/02/2018  . S/P emergency C-section 07/29/2016   Assessment: Postpartum HELLP UTI  Plan: Admit to [facility]SC unit MgSO4 Rocephin Procardia Mngt per Dr. Debroah Baller, CNM  11/22/2019, 12:10 AM

## 2019-11-22 NOTE — Lactation Note (Signed)
Lactation Consultation Note  Patient Name: DNIYAH GRANT NTIRW'E Date: 11/22/2019 Reason for consult: Mother's request;Other (Comment) (Maternal readmit)  [phone] - RN paged for lactation to see Ms. Bradway, a readmitted patient to the [facility] floor. She had a DEBP set up in the room as well as her 16 day old son, Fransico Michael.  Ms. Kage wanted to express her concern that she was not seen by lactation during her postpartum stay. We discussed how the lactation orders worked, and I asked her if she was offered lactation. Ms. Klingbeil states that she was not offered.   She is breast feeding her son, but she is also giving some formula due to concerns that he is still hungry after breast feeding.  I agreed to follow up this afternoon to watch him latch. For now, we reviewed how to use the DEBP. I recommended breast feeding Fransico Michael first (using both breasts) and pumping after for about 10 minutes (for stimulation purposes, mainly).  I agreed to return with size 30 flanges. I also offered to assist with latching.   Baby has not had a weight check since discharge three days ago.  Maternal Data Formula Feeding for Exclusion: No  Interventions Interventions: Breast feeding basics reviewed;DEBP  Lactation Tools Discussed/Used Tools: Pump Breast pump type: Double-Electric Breast Pump;Manual Pump Review: Setup, frequency, and cleaning Date initiated:: 11/22/19   Consult Status Consult Status: Follow-up Date: 11/22/19 Follow-up type: In-patient    Walker Shadow 11/22/2019, 11:56 AM

## 2019-11-22 NOTE — Lactation Note (Signed)
Lactation Consultation Note  Patient Name: KAILEN NAME GURKY'H Date: 11/22/2019 Reason for consult: Follow-up assessment  I followed up with Ms. Mayor. I provided her with size 30 flanges.  We woke baby Brennon up and latched him first to the right breast for about 10 minutes and then to the left breast. Baby latches easily to her everted nipples. He fed with rhythmic suckling sequences for about 7 minutes before becoming sleepy. I showed Ms. Goertzen how to gently pester him to keep him awake. When he became very sleepy, we removed him from the breast. Ms. Carignan burped him and the placed him on the left breast. I noted spontaneous audible swallows on this side.   I recommended that she offer both breasts in a feeding. We will track her feeding times, length of feedings, and pump and supplementation amounts. She last fed baby around 10 am  And noted that she pumped less on this side than her other, unused breast. I praised her and stated that this is a good indication that baby is transferring milk at the breast.  They have a follow up at the [facility] scheduled for tomorrow am. I recommended that if baby can still go for a weight check, that would be good. Or I suggested that she call them today to see what they recommended.  Plan: Breast feed on demand 8-12 times a day. Offer both breasts in a feeding. Post pump 10 minutes if baby latches or pump 15-20 minutes if baby does not latch. Supplement her EBM first, as needed. She can start with lower volumes such as 15-20 mls, and move up from there.  Lactation to follow up tomorrow.   Maternal Data Does the patient have breastfeeding experience prior to this delivery?: Yes  Feeding Feeding Type: Breast Fed  LATCH Score Latch: Grasps breast easily, tongue down, lips flanged, rhythmical sucking.  Audible Swallowing: Spontaneous and intermittent  Type of Nipple: Everted at rest and after stimulation  Comfort (Breast/Nipple): Soft /  non-tender  Hold (Positioning): No assistance needed to correctly position infant at breast.  LATCH Score: 10  Interventions Interventions: Breast feeding basics reviewed;Hand express;Support pillows;Adjust position;DEBP  Lactation Tools Discussed/Used Tools: Pump;Flanges Flange Size: 30 Breast pump type: Double-Electric Breast Pump Pump Review: Setup, frequency, and cleaning   Consult Status Consult Status: Follow-up Date: 11/23/19 Follow-up type: In-patient    Walker Shadow 11/22/2019, 2:47 PM

## 2019-11-23 DIAGNOSIS — O1495 Unspecified pre-eclampsia, complicating the puerperium: Secondary | ICD-10-CM

## 2019-11-23 DIAGNOSIS — Z8759 Personal history of other complications of pregnancy, childbirth and the puerperium: Secondary | ICD-10-CM

## 2019-11-23 DIAGNOSIS — Z98891 History of uterine scar from previous surgery: Secondary | ICD-10-CM

## 2019-11-23 DIAGNOSIS — O1425 HELLP syndrome, complicating the puerperium: Principal | ICD-10-CM

## 2019-11-23 LAB — COMPREHENSIVE METABOLIC PANEL
ALT: 76 U/L — ABNORMAL HIGH (ref 0–44)
AST: 32 U/L (ref 15–41)
Albumin: 2.8 g/dL — ABNORMAL LOW (ref 3.5–5.0)
Alkaline Phosphatase: 84 U/L (ref 38–126)
Anion gap: 8 (ref 5–15)
BUN: 8 mg/dL (ref 6–20)
CO2: 24 mmol/L (ref 22–32)
Calcium: 8.1 mg/dL — ABNORMAL LOW (ref 8.9–10.3)
Chloride: 106 mmol/L (ref 98–111)
Creatinine, Ser: 0.9 mg/dL (ref 0.44–1.00)
GFR calc Af Amer: >60 mL/min (ref >60)
GFR calc non Af Amer: >60 mL/min (ref >60)
Glucose, Bld: 101 mg/dL — ABNORMAL HIGH (ref 70–99)
Potassium: 3.6 mmol/L (ref 3.5–5.1)
Sodium: 138 mmol/L (ref 135–145)
Total Bilirubin: 0.6 mg/dL (ref 0.3–1.2)
Total Protein: 6.6 g/dL (ref 6.5–8.1)

## 2019-11-23 LAB — CBC
HCT: 36.2 % (ref 36.0–46.0)
Hemoglobin: 12.4 g/dL (ref 12.0–15.0)
MCH: 29.7 pg (ref 26.0–34.0)
MCHC: 34.3 g/dL (ref 30.0–36.0)
MCV: 86.8 fL (ref 80.0–100.0)
Platelets: 195 10*3/uL (ref 150–400)
RBC: 4.17 MIL/uL (ref 3.87–5.11)
RDW: 13.2 % (ref 11.5–15.5)
WBC: 7.4 10*3/uL (ref 4.0–10.5)
nRBC: 0 % (ref 0.0–0.2)

## 2019-11-23 MED ORDER — CEPHALEXIN 500 MG PO CAPS: 500.0000 mg | ORAL_CAPSULE | Freq: Three times a day (TID) | ORAL | 0 refills | Status: AC

## 2019-11-23 MED ORDER — NIFEDIPINE ER 30 MG PO TB24: 30.0000 mg | ORAL_TABLET | Freq: Every day | ORAL | 3 refills | Status: DC

## 2019-11-23 MED ORDER — CEPHALEXIN 500 MG PO CAPS: 500.0000 mg | ORAL_CAPSULE | Freq: Three times a day (TID) | ORAL | 0 refills | Status: DC

## 2019-11-23 MED FILL — NIFEdipine ER 30 MG TB24: 30 | 30 days supply | Qty: 30 | Fill #0

## 2019-11-23 MED FILL — CEPHALEXIN 500 MG CAPS: 500 | 7 days supply | Qty: 21 | Fill #0

## 2019-11-23 NOTE — Progress Notes (Signed)
FACULTY PRACTICE Postpartum PROGRESS NOTE  Erica Hahn is a 29 y.o. G3P3003 at Unknown who is admitted for postpartum preeclampsia/HELLP.  POD 6.  Pt denies headache, visual changes or RUQ pain.  Pt remains concerned about hematuria.  Length of Stay:  1 Days. Admitted 11/21/2019  Subjective: Pt seen, doing well, no acute complaints.  Pt states she still sees hematuria when she voids, but denies dysuria.    Vitals:  Blood pressure 131/79, pulse 84, temperature 97.8 F (36.6 C), temperature source Oral, resp. rate 16, SpO2 98 %, unknown if currently breastfeeding. Physical Examination: CONSTITUTIONAL: Well-developed, well-nourished female in no acute distress.  HENT:  Normocephalic, atraumatic, External right and left ear normal. Oropharynx is clear and moist EYES: Conjunctivae and EOM are normal. Pupils are equal, round, and reactive to light. No scleral icterus.  NECK: Normal range of motion, supple, no masses. SKIN: Skin is warm and dry. No rash noted. Not diaphoretic. No erythema. No pallor. NEUROLGIC: Alert and oriented to person, place, and time. Normal reflexes, muscle tone coordination. No cranial nerve deficit noted. PSYCHIATRIC: Normal mood and affect. Normal behavior. Normal judgment and thought content. CARDIOVASCULAR: Normal heart rate noted, regular rhythm RESPIRATORY: Effort and breath sounds normal, no problems with respiration noted MUSCULOSKELETAL: Normal range of motion. No edema and no tenderness. ABDOMEN: Soft, nontender, nondistended, wound c/d/I CERVIX: deferred    Results for orders placed or performed during the hospital encounter of 11/21/19 (from the past 48 hour(s))  CBC     Status: Abnormal   Collection Time: 11/21/19 10:41 PM  Result Value Ref Range   WBC 9.3 4.0 - 10.5 K/uL   RBC 4.02 3.87 - 5.11 MIL/uL   Hemoglobin 11.9 (L) 12.0 - 15.0 g/dL   HCT 62.8 (L) 36 - 46 %   MCV 86.8 80.0 - 100.0 fL   MCH 29.6 26.0 - 34.0 pg   MCHC 34.1 30.0 - 36.0  g/dL   RDW 31.5 17.6 - 16.0 %   Platelets 196 150 - 400 K/uL   nRBC 0.0 0.0 - 0.2 %    Comment: Performed at [facility], [address], [place], [place] [address]  Comprehensive metabolic panel     Status: Abnormal   Collection Time: 11/21/19 10:41 PM  Result Value Ref Range   Sodium 141 135 - 145 mmol/L   Potassium 3.8 3.5 - 5.1 mmol/L   Chloride 111 98 - 111 mmol/L   CO2 23 22 - 32 mmol/L   Glucose, Bld 96 70 - 99 mg/dL    Comment: Glucose reference range applies only to samples taken after fasting for at least 8 hours.   BUN 9 6 - 20 mg/dL   Creatinine, Ser 6.26 0.44 - 1.00 mg/dL   Calcium 8.8 (L) 8.9 - 10.3 mg/dL   Total Protein 6.3 (L) 6.5 - 8.1 g/dL   Albumin 2.8 (L) 3.5 - 5.0 g/dL   AST 948 (H) 15 - 41 U/L   ALT 127 (H) 0 - 44 U/L   Alkaline Phosphatase 84 38 - 126 U/L   Total Bilirubin 0.8 0.3 - 1.2 mg/dL   GFR calc non Af Amer >60 >60 mL/min   GFR calc Af Amer >60 >60 mL/min   Anion gap 7 5 - 15    Comment: Performed at [facility], [address], [place], [place] [address]  Urinalysis, Routine w reflex microscopic     Status: Abnormal   Collection Time: 11/21/19 11:11 PM  Result Value Ref Range   Color, Urine RED (A) YELLOW    Comment: BIOCHEMICALS MAY BE AFFECTED BY COLOR   APPearance TURBID (A) CLEAR   Specific Gravity, Urine 1.010 1.005 - 1.030   pH 7.5 5.0 - 8.0   Glucose, UA 100 (A) NEGATIVE mg/dL   Hgb urine dipstick LARGE (A) NEGATIVE   Bilirubin Urine NEGATIVE NEGATIVE   Ketones, ur NEGATIVE NEGATIVE mg/dL   Protein, ur >106 (A) NEGATIVE mg/dL   Nitrite POSITIVE (A) NEGATIVE   Leukocytes,Ua SMALL (A) NEGATIVE    Comment: Performed at [facility], [address] [place], [place] [address]  Urine culture     Status: None   Collection Time: 11/21/19 11:11 PM   Specimen: OB Clean Catch; Urine  Result Value Ref Range   Specimen Description OB CLEAN CATCH    Special Requests NONE    Culture      NO GROWTH Performed at [facility], [address] [place], [place] [address]    Report Status 11/22/2019 FINAL   Urinalysis, Microscopic (reflex)     Status: Abnormal   Collection Time: 11/21/19 11:11 PM  Result Value Ref Range   RBC / HPF >50 0 - 5 RBC/hpf   WBC, UA 6-10 0 - 5 WBC/hpf   Bacteria, UA FEW (A) NONE SEEN   Squamous Epithelial / LPF 0-5 0 - 5    Comment: Performed at [facility], [address], [place], [place] [address]  Comprehensive metabolic panel     Status: Abnormal   Collection Time: 11/22/19  4:37 AM  Result Value Ref Range   Sodium 139 135 - 145 mmol/L   Potassium 3.3 (L) 3.5 - 5.1 mmol/L   Chloride 110 98 - 111 mmol/L   CO2 21 (L) 22 - 32 mmol/L   Glucose, Bld 155 (H) 70 - 99 mg/dL    Comment: Glucose reference range applies only to samples taken after fasting for at least 8 hours.   BUN 8 6 - 20 mg/dL   Creatinine, Ser 9.38 (H) 0.44 - 1.00 mg/dL   Calcium 8.4 (L) 8.9 - 10.3 mg/dL   Total Protein 5.7 (L) 6.5 - 8.1 g/dL   Albumin 2.6 (L) 3.5 - 5.0 g/dL   AST 73 (H) 15 - 41 U/L   ALT 108 (H) 0 - 44 U/L   Alkaline Phosphatase 79 38 - 126 U/L   Total Bilirubin 0.7 0.3 - 1.2 mg/dL   GFR calc non Af Amer >60 >60 mL/min   GFR calc Af Amer >60 >60 mL/min   Anion gap 8 5 - 15    Comment: Performed at [facility], [address], [place], [place] [address]  CBC     Status: Abnormal   Collection Time: 11/22/19  4:37 AM  Result Value Ref Range   WBC 8.2 4.0 - 10.5 K/uL   RBC 3.74 (L) 3.87 - 5.11 MIL/uL   Hemoglobin 11.2 (L) 12.0 - 15.0 g/dL   HCT 37.1 (L) 36 - 46 %   MCV 87.7 80.0 - 100.0 fL   MCH 29.9 26.0 - 34.0 pg   MCHC 34.1 30.0 - 36.0 g/dL   RDW 69.6 78.9 - 38.1 %   Platelets 166 150 - 400 K/uL   nRBC 0.0 0.0 - 0.2 %    Comment: Performed at [facility], [address], [place], [place] [address]  CBC     Status: None  Collection Time: 11/23/19  4:39 AM  Result Value Ref Range   WBC 7.4 4.0 - 10.5 K/uL   RBC 4.17 3.87 - 5.11 MIL/uL   Hemoglobin 12.4  12.0 - 15.0 g/dL   HCT 87.5 36 - 46 %   MCV 86.8 80.0 - 100.0 fL   MCH 29.7 26.0 - 34.0 pg   MCHC 34.3 30.0 - 36.0 g/dL   RDW 64.3 32.9 - 51.8 %   Platelets 195 150 - 400 K/uL   nRBC 0.0 0.0 - 0.2 %    Comment: Performed at [facility], [address], [place], [place] [address]  Comprehensive metabolic panel     Status: Abnormal   Collection Time: 11/23/19  4:39 AM  Result Value Ref Range   Sodium 138 135 - 145 mmol/L   Potassium 3.6 3.5 - 5.1 mmol/L   Chloride 106 98 - 111 mmol/L   CO2 24 22 - 32 mmol/L   Glucose, Bld 101 (H) 70 - 99 mg/dL    Comment: Glucose reference range applies only to samples taken after fasting for at least 8 hours.   BUN 8 6 - 20 mg/dL   Creatinine, Ser 0.63 0.44 - 1.00 mg/dL   Calcium 8.1 (L) 8.9 - 10.3 mg/dL   Total Protein 6.6 6.5 - 8.1 g/dL   Albumin 2.8 (L) 3.5 - 5.0 g/dL   AST 32 15 - 41 U/L   ALT 76 (H) 0 - 44 U/L   Alkaline Phosphatase 84 38 - 126 U/L   Total Bilirubin 0.6 0.3 - 1.2 mg/dL   GFR calc non Af Amer >60 >60 mL/min   GFR calc Af Amer >60 >60 mL/min   Anion gap 8 5 - 15    Comment: Performed at [facility], [address] [place], [place] [address]    I have reviewed the patient's current medications.  ASSESSMENT: Active Problems:   HELLP (hemolytic anemia/elev liver enzymes/low platelets in pregnancy), unspecified trimester   PLAN: Pt has completed magnesium sulfate therapy.  Blood pressure has improved with procardia.  No residual effects of PP HELLP at this time.  Pt still has hematuria. Will prepare for discharge with outpatient follow up with nephrology   Continue routine antenatal care.   Mariel Aloe, MD [facility] Faculty Attending, Center for [facility] 11/23/2019 8:37 AM

## 2019-11-23 NOTE — Discharge Summary (Signed)
Postpartum Discharge Summary  Date of Service updated  11/23/2019     Patient Name: Erica Hahn DOB: Apr 25, 1991 MRN: [id number]  Date of admission: 11/21/2019 Date of discharge: 11/23/2019  Admitting diagnosis: HELLP (hemolytic anemia/elev liver enzymes/low platelets in pregnancy), unspecified trimester [O14.20] Secondary diagnosis:  Active Problems:   HELLP (hemolytic anemia/elev liver enzymes/low platelets in pregnancy), unspecified trimester   Preeclampsia in postpartum period  Additional problems: gross hematuria    Discharge diagnosis: postpartum HELLP/preeclampsia                                              Post partum procedures:24 hours magnesium sulfate Hospital course:  Pt was admitted on 11/22/2019 with gross hematuria.  During the evaluation it was found that pt actually had postpartum preeclampsia with a HELLP variant.  She was admitted for 24 hour magnesium sulfate therapy , blood pressure management and empiric therapy for UTI.  Pt did well with the magnesium sulfate and procardia XL was able to control the hypertension.  The hematuria persisted, but that will be followed up as an outpatient with nephrology. Magnesium Sulfate received: Yes: Seizure prophylaxis  Physical exam  Vitals:   11/23/19 0057 11/23/19 0529 11/23/19 0530 11/23/19 0808  BP: 133/80 131/83  131/79  Pulse: 83 76  84  Resp: 18 18  16   Temp: 97.8 F (36.6 C) 98 F (36.7 C)  97.8 F (36.6 C)  TempSrc: Oral Oral  Oral  SpO2: 98% 98% 98% 98%   General: alert, cooperative and no distress Incision: Healing well with no significant drainage DVT Evaluation: No evidence of DVT seen on physical exam. Labs: Lab Results  Component Value Date   WBC 7.4 11/23/2019   HGB 12.4 11/23/2019   HCT 36.2 11/23/2019   MCV 86.8 11/23/2019   PLT 195 11/23/2019   CMP Latest Ref Rng & Units 11/23/2019  Glucose 70 - 99 mg/dL 11/25/2019)  BUN 6 - 20 mg/dL 8  Creatinine 355(D - 3.22 mg/dL 0.25  Sodium 4.27 - 062  mmol/L 138  Potassium 3.5 - 5.1 mmol/L 3.6  Chloride 98 - 111 mmol/L 106  CO2 22 - 32 mmol/L 24  Calcium 8.9 - 10.3 mg/dL 8.1(L)  Total Protein 6.5 - 8.1 g/dL 6.6  Total Bilirubin 0.3 - 1.2 mg/dL 0.6  Alkaline Phos 38 - 126 U/L 84  AST 15 - 41 U/L 32  ALT 0 - 44 U/L 76(H)   376 Score: Edinburgh Postnatal Depression Scale Screening Tool 11/18/2019  I have been able to laugh and see the funny side of things. 0  I have looked forward with enjoyment to things. 0  I have blamed myself unnecessarily when things went wrong. 1  I have been anxious or worried for no good reason. 0  I have felt scared or panicky for no good reason. 0  Things have been getting on top of me. 0  I have been so unhappy that I have had difficulty sleeping. 0  I have felt sad or miserable. 0  I have been so unhappy that I have been crying. 0  The thought of harming myself has occurred to me. 0  Edinburgh Postnatal Depression Scale Total 1     After visit meds:  Allergies as of 11/23/2019   No Known Allergies     Medication List    STOP  taking these medications   Gojji Weight Scale Misc     TAKE these medications   Blood Pressure Monitor Automat Devi 1 Device by Does not apply route daily. Automatic blood pressure cuff regular size or large. Monitor blood pressure on a regularly bases at home. ICD-10 code: O09.90.   cephALEXin 500 MG capsule Commonly known as: KEFLEX Take 1 capsule (500 mg total) by mouth every 8 (eight) hours for 7 days.   ibuprofen 800 MG tablet Commonly known as: ADVIL Take 1 tablet (800 mg total) by mouth every 6 (six) hours.   multivitamin-prenatal 27-0.8 MG Tabs tablet Take 1 tablet by mouth daily at 12 noon.   NIFEdipine 30 MG 24 hr tablet Commonly known as: ADALAT CC Take 1 tablet (30 mg total) by mouth daily.   oxyCODONE-acetaminophen 5-325 MG tablet Commonly known as: PERCOCET/ROXICET Take 2 tablets by mouth every 4 (four) hours as needed for severe pain ((when  tolerating fluids)).        Discharge home in stable condition Discharge instruction: per After Visit Summary and Postpartum booklet. Activity: Advance as tolerated. Pelvic rest for 6 weeks.  Diet: routine diet Future Appointments: Future Appointments  Date Time Provider Department Center  11/30/2019 10:50 AM Judeth Horn, NP [facility] None  12/30/2019 11:10 AM Gerrit Heck, CNM [facility] None   Follow up Visit:  Follow-up Information    [facility]. Schedule an appointment as soon as possible for a visit in 1 week(s).   Specialty: Obstetrics and Gynecology Why: any doctor, blood pressure check Contact information: [address] [place] [address] [phone]               Please schedule this patient for a In person postpartum visit in 1 week with the following provider: any MD. Additional Postpartum F/U:BP check 1 week    11/23/2019 Warden Fillers, MD

## 2019-11-23 NOTE — Discharge Instructions (Signed)
HELLP Syndrome  HELLP syndrome is a severe complication of preeclampsia, a disorder of pregnancy that causes high blood pressure and other serious problems. In most cases, the syndrome develops before 35 weeks of pregnancy, but it can also develop right after childbirth. The condition is life-threatening to both the mother and baby. The letters in HELLP stand for these problems:  H - Hemolytic anemia, hemolysis. This refers to the destruction of blood cells.  EL - Elevated liver enzymes. This is a sign of liver damage.  LP - Low platelet count. This refers to blood cells that help stop bleeding. What are the causes? The cause of this condition is not known. What increases the risk? You are more likely to develop this condition if:  You are pregnant with more than one baby.  You have other medical conditions, such as diabetes or an autoimmune disease.  Any of these things happened during a previous pregnancy: ? You had preeclampsia or eclampsia. ? Your baby did not grow as expected. ? Your baby was born prematurely. ? Your placenta separated from your uterus (placental abruption). ? You lost your baby (fetal death). What are the signs or symptoms? Symptoms of this condition include:  Severe headache.  Blurry or double vision.  Pain in the upper right abdomen.  Pain in the shoulder, neck, and upper body.  Fatigue.  Feeling sick.  Seizures.  Swelling of the hands, face, legs, and feet.  Sudden weight gain.  High blood pressure. How is this diagnosed? This condition may be diagnosed with blood and urine tests. These tests may include:  A complete blood count.  A liver function test.  A kidney function test.  A measurement of the salts and other chemicals in your body (electrolytes).  A blood coagulation test.  Measurement of protein in the urine. You may also have other tests, including:  Checking your blood pressure.  Fetal ultrasound.  Monitoring your  baby's heart rate. How is this treated? This condition is treated by delivering the baby as soon as possible. You may be given medicines to start contractions (induce labor) so that the baby can be delivered vaginally, or you may have a cesarean delivery. Additional treatment may include:  Receiving an infusion of magnesium sulphate before delivery. Magnesium sulphate is a medicine that helps prevent seizures.  Receiving medicines to lower and control blood pressure. These may be given if your blood pressure is too high.  Receiving steroid hormones (corticosteroids) to help your baby's lungs mature faster. During your treatment, you and your baby will be monitored and your conditions will be managed. How is this prevented? If you are at risk for preeclampsia, your health care provider may recommend that you take one low-dose aspirin (a dose of 81 mg) each day during your pregnancy. This will help prevent high blood pressure. You may be at risk for preeclampsia if:  You had preeclampsia or eclampsia during a previous pregnancy.  Your baby did not grow as expected during a previous pregnancy.  One of your children was born prematurely.  You experienced a placental abruption during a previous pregnancy.  You lost your baby during a previous pregnancy.  You are pregnant with more than one baby.  You have a medical condition, such as diabetes or an autoimmune disease. Contact a health care provider if:  You gain more weight than expected.  You have headaches.  You have nausea or vomiting.  You have abdominal pain.  You feel dizzy or light-headed. Get  help right away if:  You have a seizure.  You develop sudden or severe swelling anywhere in your body. This usually happens in the legs.  You have changes in your ability to speak or move your body. Summary  HELLP syndrome is a severe complication of preeclampsia, a disorder of pregnancy that causes high blood pressure and other  serious problems.  In most cases, the syndrome develops before 35 weeks of pregnancy, but it can also develop right after childbirth. The condition is life-threatening to both the mother and baby.  This condition is treated by delivering the baby as soon as possible. You may be given medicines to start contractions (induce labor) so that the baby can be delivered vaginally, or you may have a cesarean delivery.  During your treatment, you and your baby will be monitored and your conditions will be managed. This information is not intended to replace advice given to you by your health care provider. Make sure you discuss any questions you have with your health care provider. Document Revised: 04/03/2017 Document Reviewed: 05/28/2016 Elsevier Patient Education  2020 Elsevier Inc.   Hematuria, Adult Hematuria is blood in the urine. Blood may be visible in the urine, or it may be identified with a test. This condition can be caused by infections of the bladder, urethra, kidney, or prostate. Other possible causes include:  Kidney stones.  Cancer of the urinary tract.  Too much calcium in the urine.  Conditions that are passed from parent to child (inherited conditions).  Exercise that requires a lot of energy. Infections can usually be treated with medicine, and a kidney stone usually will pass through your urine. If neither of these is the cause of your hematuria, more tests may be needed to identify the cause of your symptoms. It is very important to tell your health care provider about any blood in your urine, even if it is painless or the blood stops without treatment. Blood in the urine, when it happens and then stops and then happens again, can be a symptom of a very serious condition, including cancer. There is no pain in the initial stages of many urinary cancers. Follow these instructions at home: Medicines  Take over-the-counter and prescription medicines only as told by your health  care provider.  If you were prescribed an antibiotic medicine, take it as told by your health care provider. Do not stop taking the antibiotic even if you start to feel better. Eating and drinking  Drink enough fluid to keep your urine clear or pale yellow. It is recommended that you drink 3-4 quarts (2.8-3.8 L) a day. If you have been diagnosed with an infection, it is recommended that you drink cranberry juice in addition to large amounts of water.  Avoid caffeine, tea, and carbonated beverages. These tend to irritate the bladder.  Avoid alcohol because it may irritate the prostate (men). General instructions  If you have been diagnosed with a kidney stone, follow your health care provider's instructions about straining your urine to catch the stone.  Empty your bladder often. Avoid holding urine for long periods of time.  If you are female: ? After a bowel movement, wipe from front to back and use each piece of toilet paper only once. ? Empty your bladder before and after sex.  Pay attention to any changes in your symptoms. Tell your health care provider about any changes or any new symptoms.  It is your responsibility to get your test results. Ask your health  care provider, or the department performing the test, when your results will be ready.  Keep all follow-up visits as told by your health care provider. This is important. Contact a health care provider if:  You develop back pain.  You have a fever.  You have nausea or vomiting.  Your symptoms do not improve after 3 days.  Your symptoms get worse. Get help right away if:  You develop severe vomiting and are unable take medicine without vomiting.  You develop severe pain in your back or abdomen even though you are taking medicine.  You pass a large amount of blood in your urine.  You pass blood clots in your urine.  You feel very weak or like you might faint.  You faint. Summary  Hematuria is blood in the  urine. It has many possible causes.  It is very important that you tell your health care provider about any blood in your urine, even if it is painless or the blood stops without treatment.  Take over-the-counter and prescription medicines only as told by your health care provider.  Drink enough fluid to keep your urine clear or pale yellow. This information is not intended to replace advice given to you by your health care provider. Make sure you discuss any questions you have with your health care provider. Document Revised: 09/15/2018 Document Reviewed: 05/24/2016 Elsevier Patient Education  2020 [employer].

## 2019-11-23 NOTE — Lactation Note (Signed)
Lactation Consultation Note  Patient Name: Erica Hahn KZLDJ'T Date: 11/23/2019   Mom resting in bed with infant asleep in bassinet and support person in recliner sleeping.  Mom requested LC come back after she naps.  Mom denies immediate needs at this time.  Mom was encouraged to call out for [facility] when she wakes.  Maternal Data    Feeding    LATCH Score                   Interventions    Lactation Tools Discussed/Used     Consult Status      Maryruth Hancock [facility] 11/23/2019, 8:55 AM

## 2019-11-23 NOTE — Progress Notes (Signed)
Pt ambulated out with husband  Teaching complete   

## 2019-11-24 ENCOUNTER — Telehealth: Payer: Self-pay | Admitting: *Deleted

## 2019-11-24 DIAGNOSIS — O1420 HELLP syndrome (HELLP), unspecified trimester: Secondary | ICD-10-CM

## 2019-11-24 MED FILL — Sodium Chloride Irrigation Soln 0.9%: Qty: 3000 | Status: AC

## 2019-11-24 MED FILL — Heparin Sodium (Porcine) Inj 1000 Unit/ML: INTRAMUSCULAR | Qty: 30 | Status: AC

## 2019-11-24 MED FILL — Sodium Chloride IV Soln 0.9%: INTRAVENOUS | Qty: 1000 | Status: AC

## 2019-11-24 NOTE — Telephone Encounter (Signed)
Contacted pt to complete transition of care assessement:  Transition Care Management Follow-up Telephone Call  . Medicaid Managed Care Transition Call Status:MM [facility] Call Made  . Date of discharge and from where: [facility], 11/23/19  . How have you been since you were released from the hospital? "Feeling ok"  . Any questions or concerns? Pt would like t know the status of her referral to nephrology, and how long it will take Items Reviewed: Marland Kitchen Did the pt receive and understand the discharge instructions provided? Yes  . Medications obtained and verified? Yes  . Any new allergies since your discharge? No  . Dietary orders reviewed?  yes. Do you have support at home? yes Functional Questionnaire: (I = Independent and D = Dependent)  ADLs: Independent Bathing/Dressing:Independent Meal Prep: Independent Eating: Independent Maintaining continence: Independent Transferring/Ambulation: Independent Managing Meds: Independent Follow up appointments reviewed:  PCP Hospital f/u appt confirmed? No pt to call PCP on 11/24/19 for follow up appt   Specialist Hospital f/u appt confirmed? No pt to call on 11/24/19 for follow up appts with  1 Schedule an appointment with [facility] (Obstetrics and Gynecology) in 1 week (11/30/2019); any doctor, blood pressure check; pt to see Judeth Horn at [facility] 11/30/19 at 1050 2 Follow up with Roger Kill, NP (Nephrology); pt given phone number and address; she will call on 11/24/19 to schedule follow up appt  Are transportation arrangements needed? No   If their condition worsens, is the pt aware to call PCP or go to the EmergencyDept.? Yes  Was the patient provided with contact information for the PCP's office or ED? yes Was to pt encouraged to call back with questions or concerns? yes  Order placed for [facility]

## 2019-11-30 ENCOUNTER — Ambulatory Visit: Payer: Medicaid Other | Admitting: Student

## 2019-12-04 DIAGNOSIS — J1282 Pneumonia due to coronavirus disease 2019: Secondary | ICD-10-CM | POA: Diagnosis not present

## 2019-12-04 DIAGNOSIS — R7989 Other specified abnormal findings of blood chemistry: Secondary | ICD-10-CM | POA: Diagnosis not present

## 2019-12-04 DIAGNOSIS — U071 COVID-19: Secondary | ICD-10-CM | POA: Diagnosis not present

## 2019-12-04 DIAGNOSIS — Z98891 History of uterine scar from previous surgery: Secondary | ICD-10-CM

## 2019-12-04 DIAGNOSIS — N179 Acute kidney failure, unspecified: Secondary | ICD-10-CM | POA: Diagnosis not present

## 2019-12-04 HISTORY — DX: Other specified abnormal findings of blood chemistry: R79.89

## 2019-12-04 HISTORY — DX: Acute kidney failure, unspecified: N17.9

## 2019-12-04 HISTORY — DX: COVID-19: U07.1

## 2019-12-04 HISTORY — DX: History of uterine scar from previous surgery: Z98.891

## 2019-12-05 DIAGNOSIS — J1282 Pneumonia due to coronavirus disease 2019: Secondary | ICD-10-CM | POA: Diagnosis not present

## 2019-12-05 DIAGNOSIS — Z98891 History of uterine scar from previous surgery: Secondary | ICD-10-CM | POA: Diagnosis not present

## 2019-12-05 DIAGNOSIS — R7989 Other specified abnormal findings of blood chemistry: Secondary | ICD-10-CM | POA: Diagnosis not present

## 2019-12-05 DIAGNOSIS — R109 Unspecified abdominal pain: Secondary | ICD-10-CM | POA: Diagnosis not present

## 2019-12-05 DIAGNOSIS — U071 COVID-19: Secondary | ICD-10-CM | POA: Diagnosis not present

## 2019-12-05 DIAGNOSIS — N179 Acute kidney failure, unspecified: Secondary | ICD-10-CM | POA: Diagnosis not present

## 2019-12-06 DIAGNOSIS — Z98891 History of uterine scar from previous surgery: Secondary | ICD-10-CM | POA: Diagnosis not present

## 2019-12-06 DIAGNOSIS — R7989 Other specified abnormal findings of blood chemistry: Secondary | ICD-10-CM | POA: Diagnosis not present

## 2019-12-06 DIAGNOSIS — J1282 Pneumonia due to coronavirus disease 2019: Secondary | ICD-10-CM | POA: Diagnosis not present

## 2019-12-06 DIAGNOSIS — U071 COVID-19: Secondary | ICD-10-CM | POA: Diagnosis not present

## 2019-12-06 DIAGNOSIS — N179 Acute kidney failure, unspecified: Secondary | ICD-10-CM | POA: Diagnosis not present

## 2019-12-06 DIAGNOSIS — R109 Unspecified abdominal pain: Secondary | ICD-10-CM | POA: Diagnosis not present

## 2019-12-28 ENCOUNTER — Encounter (HOSPITAL_COMMUNITY): Payer: Self-pay | Admitting: Obstetrics & Gynecology

## 2019-12-30 ENCOUNTER — Telehealth: Payer: Medicaid Other

## 2019-12-30 ENCOUNTER — Other Ambulatory Visit: Payer: Self-pay

## 2019-12-31 NOTE — Progress Notes (Signed)
Nurse attempted to call x 2 with no answer.

## 2020-02-15 ENCOUNTER — Other Ambulatory Visit: Payer: Self-pay

## 2020-02-15 NOTE — Patient Instructions (Signed)
An unsuccessful telephone outreach was attempted today. The patient was referred to the case management team for assistance with care management and care coordination.   Follow Up Plan: An initial telephone outreach has been rescheduled for: February 23, 2020 @ 3:00pm.  Roselyn Bering, BSW, MSW, LCSW Social Work Case Production designer, theatre/television/film - [facility] Managed Care [facility]  [facility]  Direct Dial: [phone]

## 2020-02-15 NOTE — Patient Outreach (Signed)
Care Coordination  02/15/2020  Erica Hahn 08-May-1990 [id number]  An unsuccessful telephone outreach was attempted today. The patient was referred to the case management team for assistance with care management and care coordination.   Follow Up Plan: An initial telephone outreach has been rescheduled for:  February 23, 2020 @ 3:00pm.  Roselyn Bering, BSW, MSW, LCSW Social Work Case Production designer, theatre/television/film - [facility] Managed Care [facility]  [facility]  Direct Dial: [phone]

## 2020-02-20 ENCOUNTER — Telehealth (INDEPENDENT_AMBULATORY_CARE_PROVIDER_SITE_OTHER): Payer: Medicaid Other | Admitting: Primary Care

## 2020-02-20 DIAGNOSIS — I10 Essential (primary) hypertension: Secondary | ICD-10-CM

## 2020-02-20 NOTE — Progress Notes (Signed)
Virtual Visit   I connected with Erica Hahn on 02/20/20 at  3:50 PM EDT by telephone and verified that I am speaking with the correct person using two identifiers.  Patient is at home I discussed the limitations, risks, security and privacy concerns of performing an evaluation and management service by telephone and the availability of in person appointments. I also discussed with the patient that there may be a patient responsible charge related to this service. The patient expressed understanding and agreed to proceed. Erica Hahn in [facility]  History of Present Illness: Erica Hahn is a 29 year old female which has not had blood pressure meds over blood pressure readings at home have been systolic 1 11-140 and diastolic 60-70   she was previously taking NIFEdipine 30mg  daily now not at all. Denies shortness of breath, headaches, chest pain or lower extremity edema Current Outpatient Medications on File Prior to Visit  Medication Sig Dispense Refill  . Blood Pressure Monitoring (BLOOD PRESSURE MONITOR AUTOMAT) DEVI 1 Device by Does not apply route daily. Automatic blood pressure cuff regular size or large. Monitor blood pressure on a regularly bases at home. ICD-10 code: O09.90. 1 each 0  . ibuprofen (ADVIL) 800 MG tablet Take 1 tablet (800 mg total) by mouth every 6 (six) hours. 30 tablet 0  . NIFEdipine (ADALAT CC) 30 MG 24 hr tablet Take 1 tablet (30 mg total) by mouth daily. 30 tablet 3  . oxyCODONE-acetaminophen (PERCOCET/ROXICET) 5-325 MG tablet Take 2 tablets by mouth every 4 (four) hours as needed for severe pain ((when tolerating fluids)). 20 tablet 0  . Prenatal Vit-Fe Fumarate-FA (MULTIVITAMIN-PRENATAL) 27-0.8 MG TABS tablet Take 1 tablet by mouth daily at 12 noon.     No current facility-administered medications on file prior to visit.     Past Medical History:  Diagnosis Date  . PID (acute pelvic inflammatory disease)   . Scoliosis     Observations/Objective: Review of Systems  All other systems reviewed and are negative.  Assessment and Plan: Diagnoses and all orders for this visit:  Essential hypertension systolic 1 11-140 and diastolic 60-70 off of medication. Check blood pressure daily at the same time. Keep a log of all blood pressure readings.Goal of therapy 130/80. DASH DIET; No salt or low sodium diet If pressure if greater than 150/100 notify me here at the office. Avoid smoked meats which are high in sodium content. Avoid soda which contains sodium and are high in sugar which increases your risk for diabetes.  Follow Up Instructions:    I discussed the assessment and treatment plan with the patient. The patient was provided an opportunity to ask questions and all were answered. The patient agreed with the plan and demonstrated an understanding of the instructions.   The patient was advised to call back or seek an in-person evaluation if the symptoms worsen or if the condition fails to improve as anticipated.  I provided 12 minutes of non-face-to-face time during this encounter.   , NP

## 2020-02-23 ENCOUNTER — Other Ambulatory Visit: Payer: Self-pay

## 2020-02-23 ENCOUNTER — Ambulatory Visit: Payer: Self-pay

## 2020-02-23 NOTE — Patient Outreach (Signed)
Care Coordination  02/23/2020  Erica Hahn [birth year] [id number]   Erica Hahn was referred to the [facility] Managed Care High Risk team for assistance with care coordination and care management services. Care coordination/care management services as part of the Medicaid benefit was offered to the patient today. The patient declined assistance offered today.   Plan: The Medicaid Managed Care High Risk team is available at any time in the future to assist with care coordination/care management services upon referral.   Roselyn Bering, BSW, MSW, LCSW Social Work Case Production designer, theatre/television/film - [facility] Managed Care [facility]  [facility]  Direct Dial: [phone]

## 2020-02-23 NOTE — Patient Instructions (Signed)
Thank you for taking time to speak with me today about care coordination and care management services available to you at no cost as part of your Medicaid benefit. These services are voluntary. Our team is available to provide assistance regarding your health care needs at any time. Please do not hesitate to reach out to me if we can be of service to you at any time in the future.  ° °Kalima Saylor, BSW, MSW, LCSW °Social Work Case Manager - Medicaid Managed Care °[place]   [facility]  °Direct Dial: [phone] ° ° °

## 2020-02-24 ENCOUNTER — Ambulatory Visit: Payer: Self-pay

## 2020-03-05 ENCOUNTER — Telehealth (INDEPENDENT_AMBULATORY_CARE_PROVIDER_SITE_OTHER): Payer: Medicaid Other | Admitting: Primary Care

## 2020-04-23 ENCOUNTER — Telehealth (INDEPENDENT_AMBULATORY_CARE_PROVIDER_SITE_OTHER): Payer: Medicaid Other | Admitting: Primary Care

## 2020-04-23 DIAGNOSIS — H10022 Other mucopurulent conjunctivitis, left eye: Secondary | ICD-10-CM

## 2020-04-23 DIAGNOSIS — R42 Dizziness and giddiness: Secondary | ICD-10-CM | POA: Diagnosis not present

## 2020-04-23 NOTE — Progress Notes (Signed)
Virtual Visit  I connected with ADALENE GULOTTA on 04/23/20 at 10:50 AM EST by telephone and verified that I am speaking with the correct person using two identifiers.  Location: Patient: @ Home  Provider: Grayce Sessions @ [facility]   I discussed the limitations, risks, security and privacy concerns of performing an evaluation and management service by telephone and the availability of in person appointments. I also discussed with the patient that there may be a patient responsible charge related to this service. The patient expressed understanding and agreed to proceed.   History of Present Illness: Miss.Tyshelle M. Mayford Knife is a 29 year old originally scheduled for a red crusty painful left eye she purchase OTC medication and resolved. She missed her Bp f/u but periodically checks her Bp last reading 116/75.    Past Medical History:  Diagnosis Date  . PID (acute pelvic inflammatory disease)   . Scoliosis    Current Outpatient Medications on File Prior to Visit  Medication Sig Dispense Refill  . Blood Pressure Monitoring (BLOOD PRESSURE MONITOR AUTOMAT) DEVI 1 Device by Does not apply route daily. Automatic blood pressure cuff regular size or large. Monitor blood pressure on a regularly bases at home. ICD-10 code: O09.90. 1 each 0  . ibuprofen (ADVIL) 800 MG tablet Take 1 tablet (800 mg total) by mouth every 6 (six) hours. 30 tablet 0  . NIFEdipine (ADALAT CC) 30 MG 24 hr tablet Take 1 tablet (30 mg total) by mouth daily. 30 tablet 3  . oxyCODONE-acetaminophen (PERCOCET/ROXICET) 5-325 MG tablet Take 2 tablets by mouth every 4 (four) hours as needed for severe pain ((when tolerating fluids)). 20 tablet 0  . Prenatal Vit-Fe Fumarate-FA (MULTIVITAMIN-PRENATAL) 27-0.8 MG TABS tablet Take 1 tablet by mouth daily at 12 noon.     No current facility-administered medications on file prior to visit.   Observations/Objective: Review of Systems  Neurological: Positive for dizziness.  All other  systems reviewed and are negative.   Assessment and Plan: Diagnoses and all orders for this visit:  Pink eye disease of left eye See HPI self resolved   Dizziness and giddiness Unknown etiology she does not remember if her menstruation was on, if she had been eating or drinking. Quick position change. Room did not spin. Asked to make a note if re occurs   Follow Up Instructions:    I discussed the assessment and treatment plan with the patient. The patient was provided an opportunity to ask questions and all were answered. The patient agreed with the plan and demonstrated an understanding of the instructions.   The patient was advised to call back or seek an in-person evaluation if the symptoms worsen or if the condition fails to improve as anticipated.  I provided 15 minutes of non-face-to-face time during this encounter.   Grayce Sessions, NP

## 2020-04-26 ENCOUNTER — Ambulatory Visit: Payer: Medicaid Other | Admitting: Advanced Practice Midwife

## 2020-07-16 NOTE — Addendum Note (Signed)
Addended by: Grayce Sessions on: 07/16/2020 10:27 PM   Modules accepted: Level of Service

## 2021-01-14 ENCOUNTER — Ambulatory Visit (INDEPENDENT_AMBULATORY_CARE_PROVIDER_SITE_OTHER): Payer: Medicaid Other

## 2021-01-20 IMAGING — US US OB < 14 WEEKS - US OB TV
1 series · 15 of 28 positions shown · non-contrast
Comparison: None.

CLINICAL DATA: Abdominal pain.

EXAM:
OBSTETRIC <14 WK US AND TRANSVAGINAL OB US
TECHNIQUE: Both transabdominal and transvaginal ultrasound examinations were
performed for complete evaluation of the gestation as well as the
maternal uterus, adnexal regions, and pelvic cul-de-sac.
Transvaginal technique was performed to assess early pregnancy.

[Series 1: us ob < 14 weeks - us ob tv · 33 acquisitions, 15 frames shown]
[im 1/33]
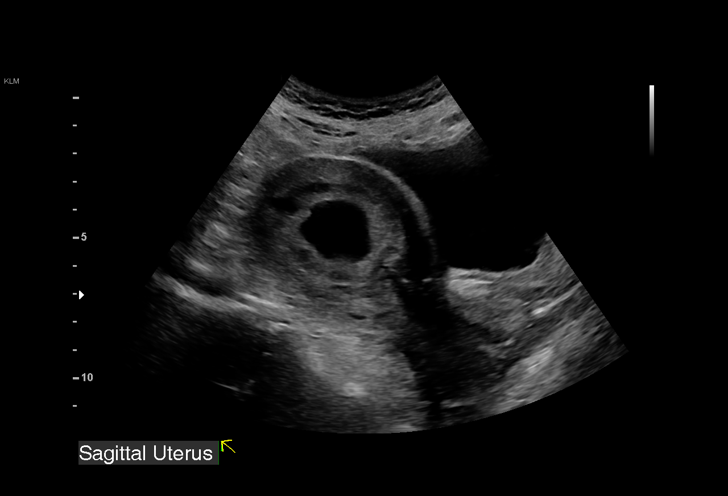
[im 3/33]
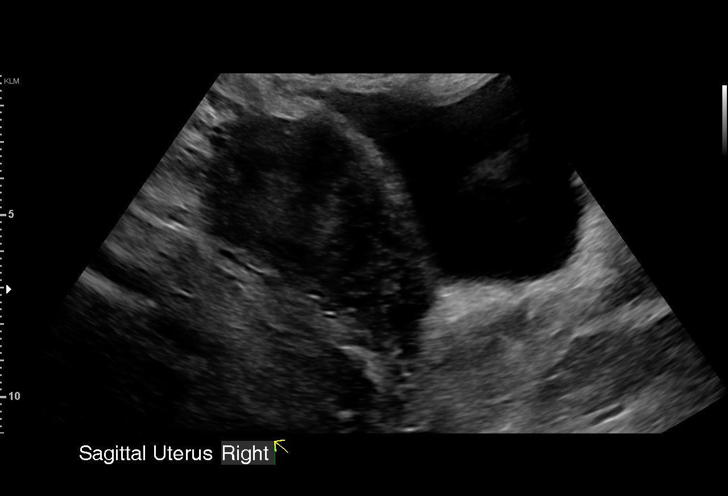
[im 5/33]
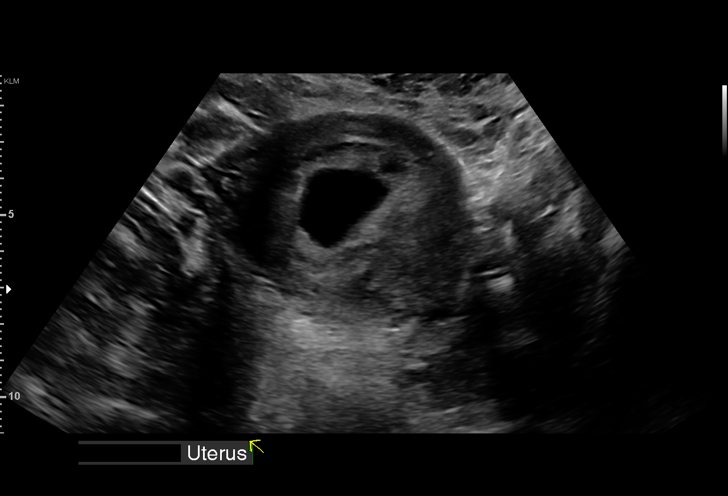
[im 8/33]
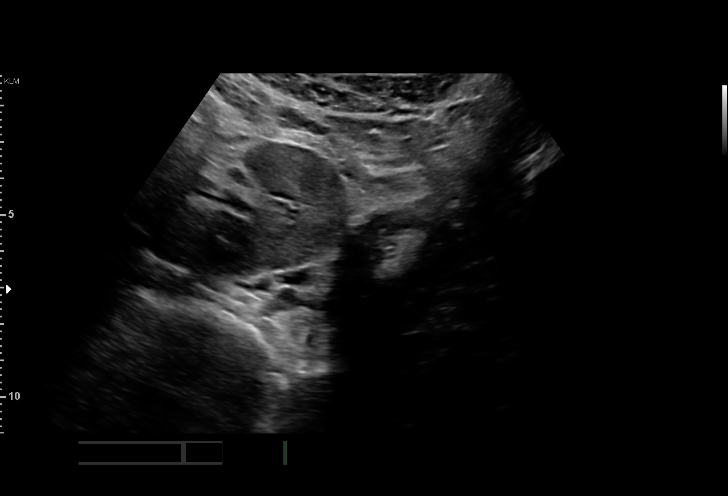
[im 10/33]
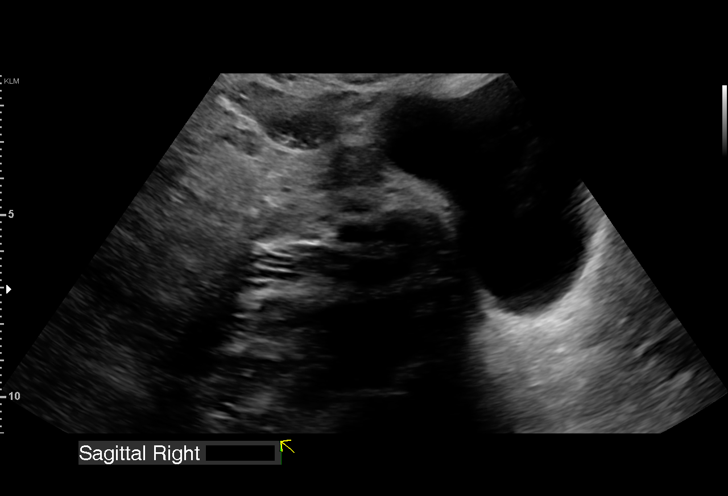
[im 12/33]
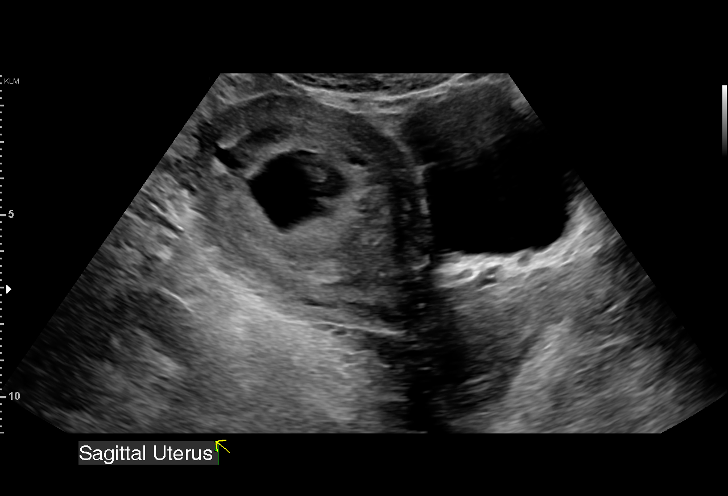
[im 15/33]
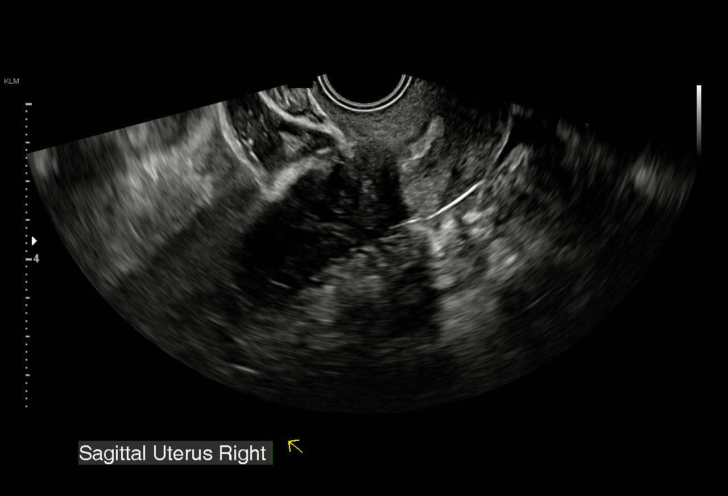
[im 17/33]
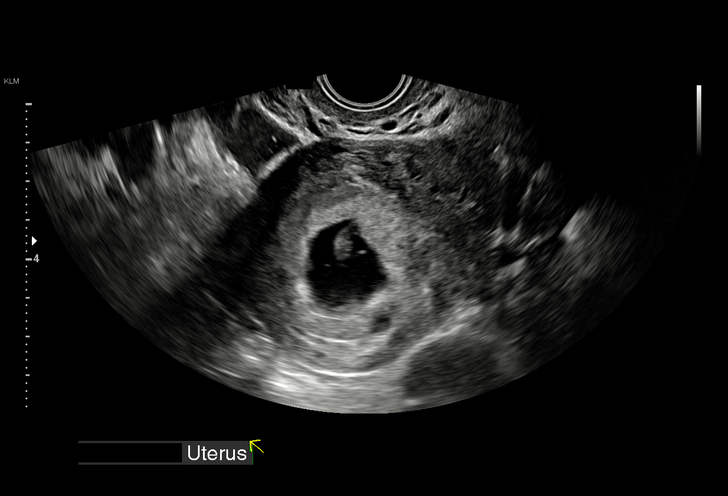
[im 18/33]
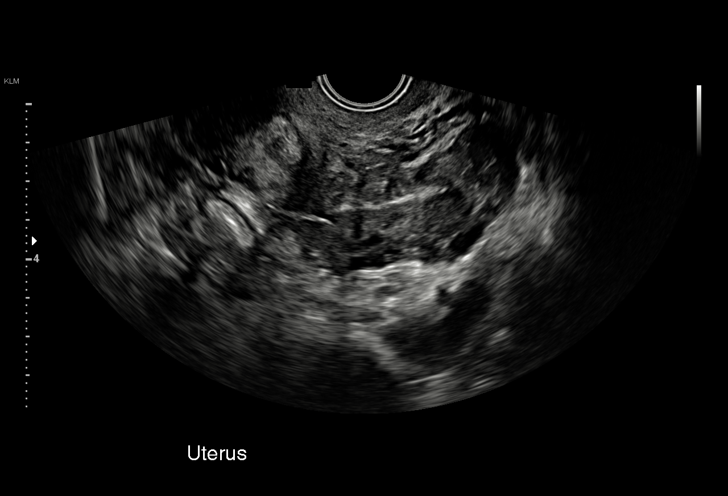
[im 21/33]
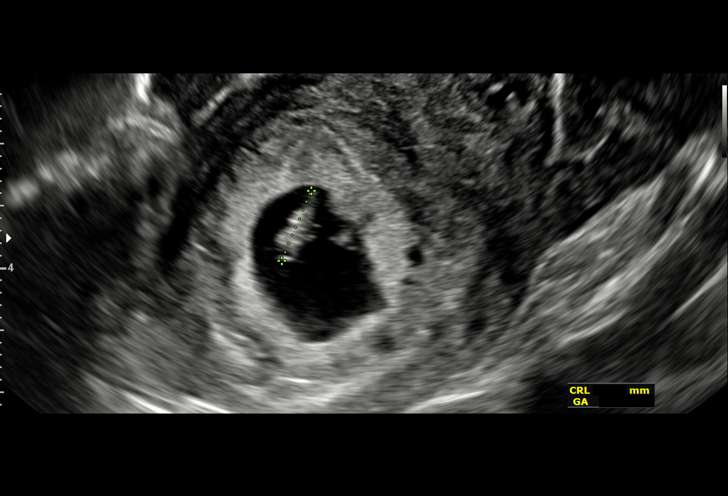
[im 23/33]
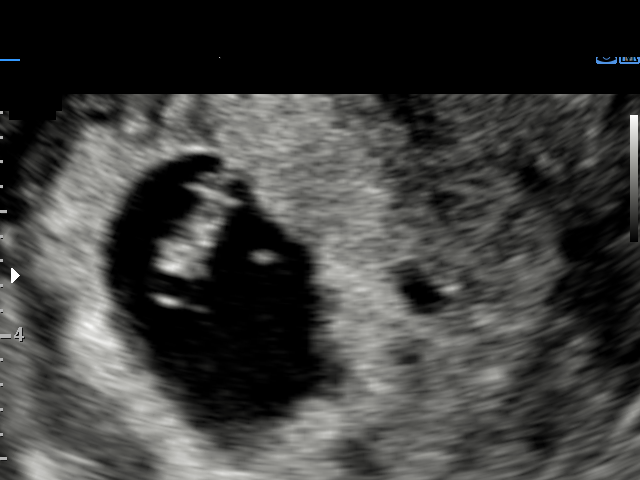
[im 25/33]
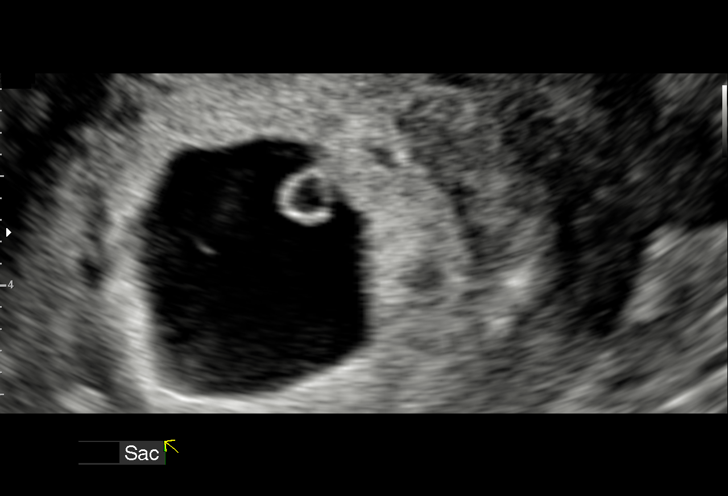
[im 28/33]
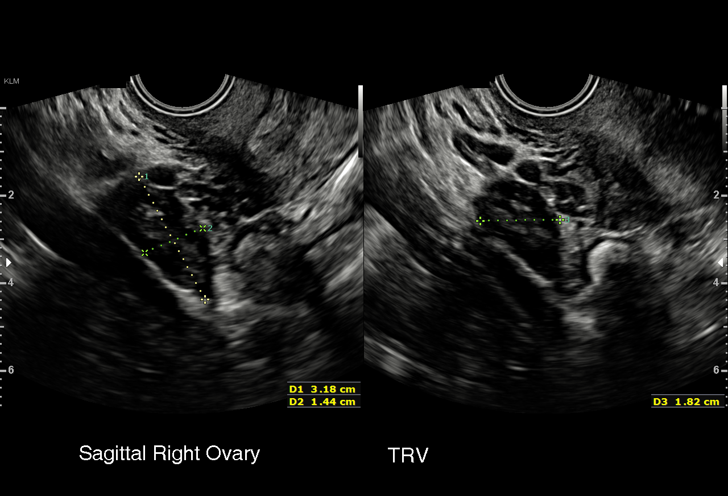
[im 30/33]
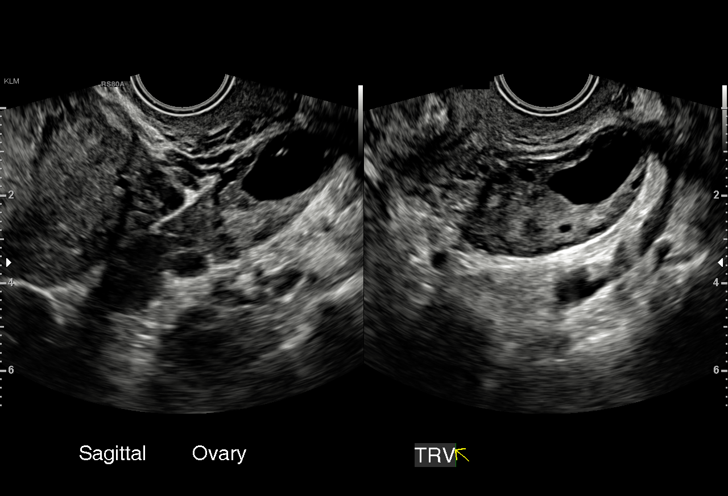
[im 33/33]
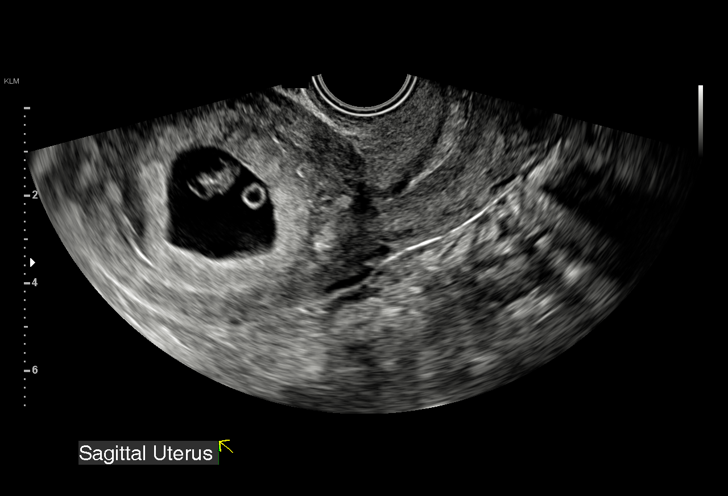

[15 of 28 positions shown; findings below may reference images not displayed]

FINDINGS: Intrauterine gestational sac: Single

Yolk sac:  Yes

Embryo:  Yes

Cardiac Activity: Yes

Heart Rate: 144 bpm

CRL:  11.68 mm   7 w   2 d                  US EDC: 11/12/2019

Subchorionic hemorrhage:  None visualized.

Maternal uterus/adnexae: Normal.
IMPRESSION: Normal-appearing single intrauterine pregnancy of approximately 7
weeks 2 days gestation.

## 2021-01-23 ENCOUNTER — Ambulatory Visit: Payer: Medicaid Other | Admitting: Certified Nurse Midwife

## 2021-03-13 ENCOUNTER — Ambulatory Visit: Payer: Medicaid Other | Admitting: Certified Nurse Midwife

## 2021-03-15 ENCOUNTER — Ambulatory Visit: Payer: Medicaid Other | Admitting: Certified Nurse Midwife

## 2021-05-19 IMAGING — US US MFM OB FOLLOW-UP
1 series · 14 of 28 positions shown · non-contrast
Comparison: none

[Series 1: us mfm ob follow-up · 14 of 48 slices shown]
[im 2/48]
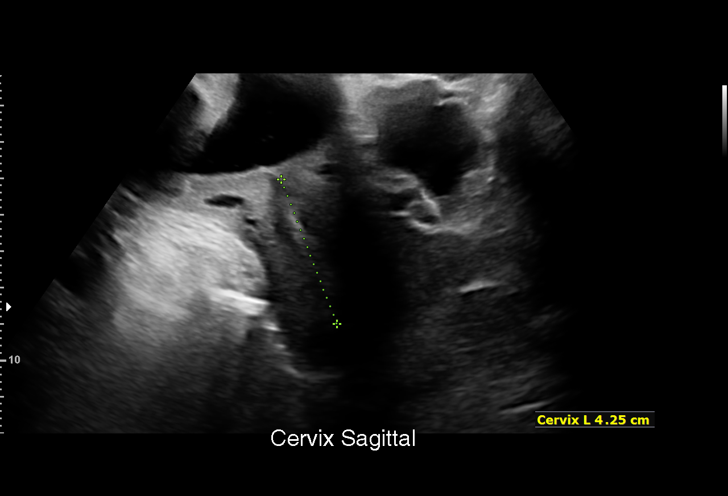
[im 6/48]
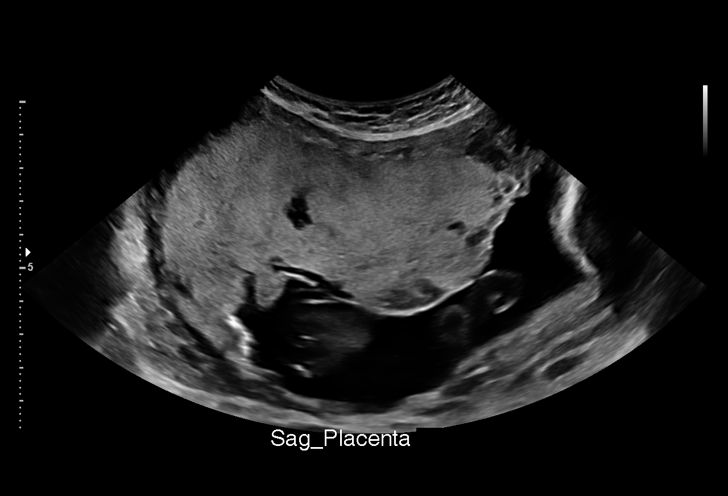
[im 9/48]
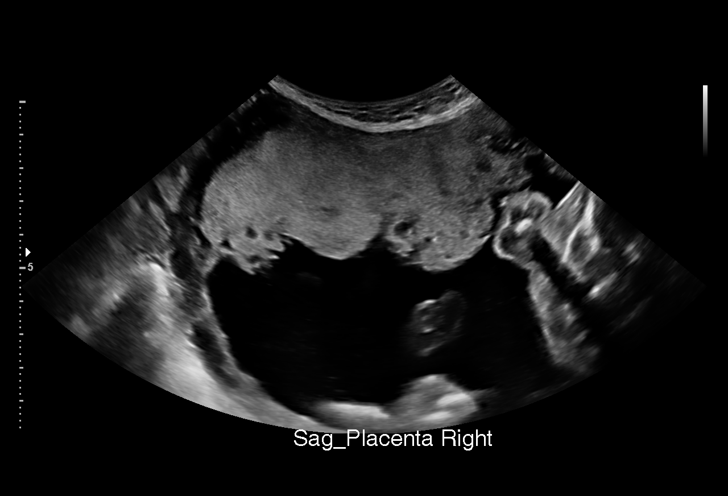
[im 13/48]
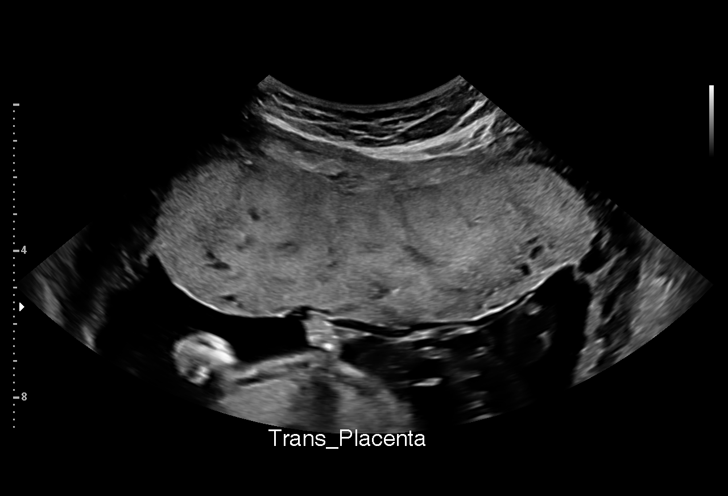
[im 16/48]
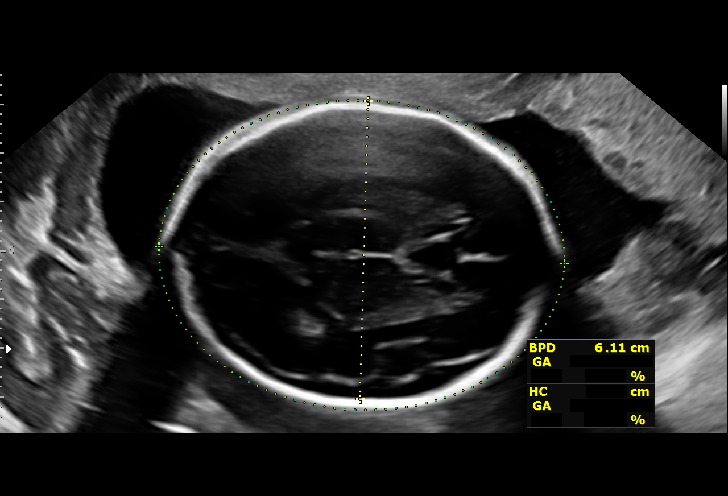
[im 20/48]
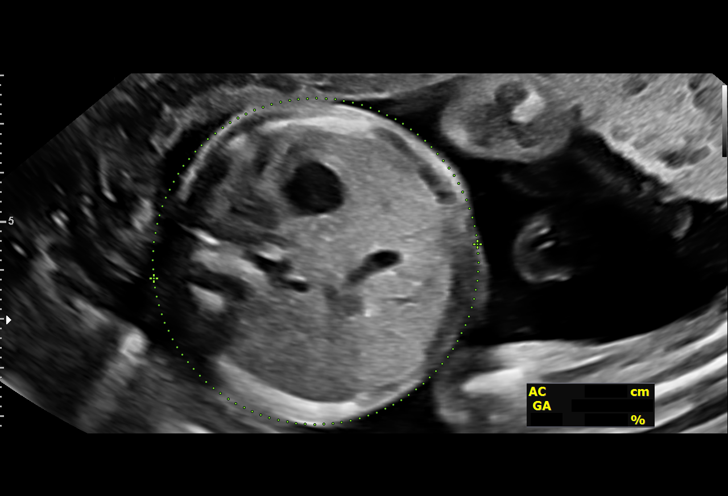
[im 23/48]
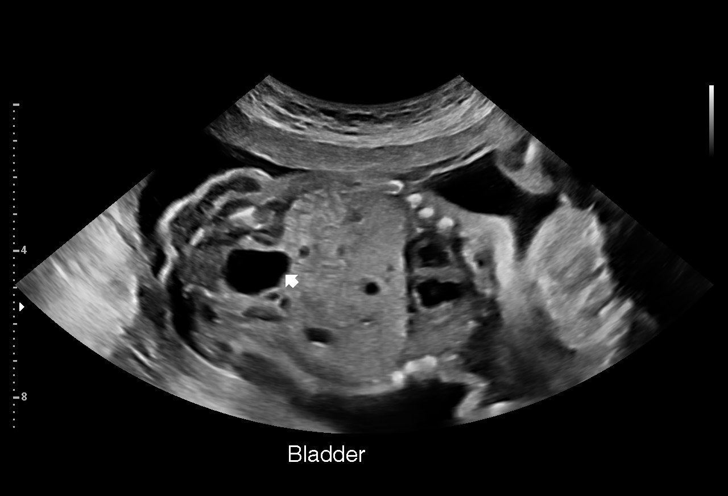
[im 27/48]
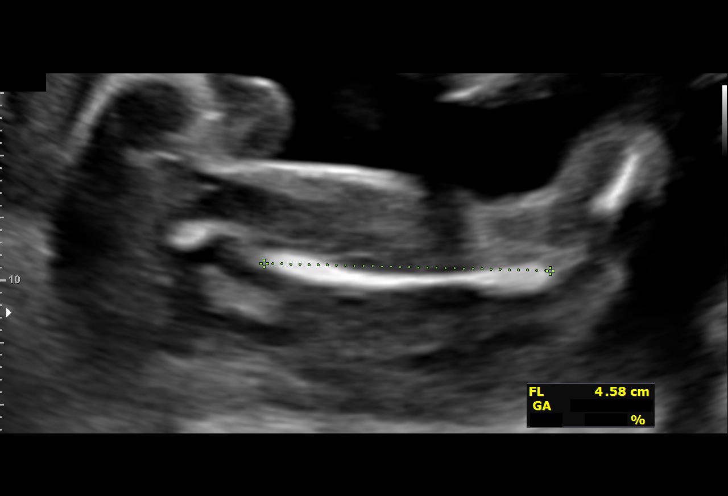
[im 30/48]
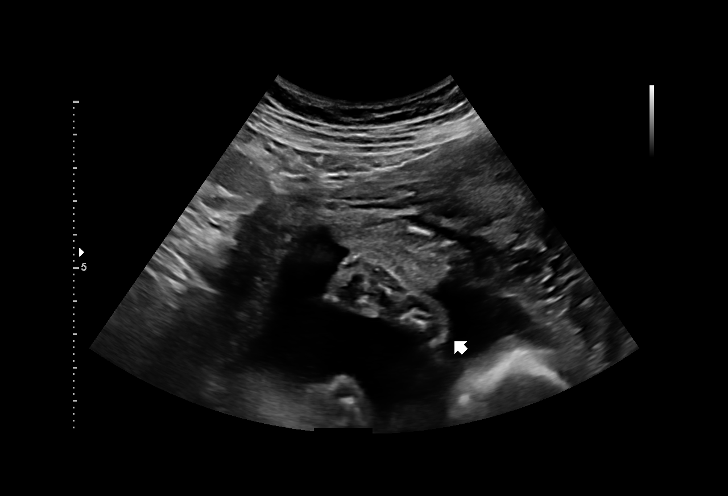
[im 34/48]
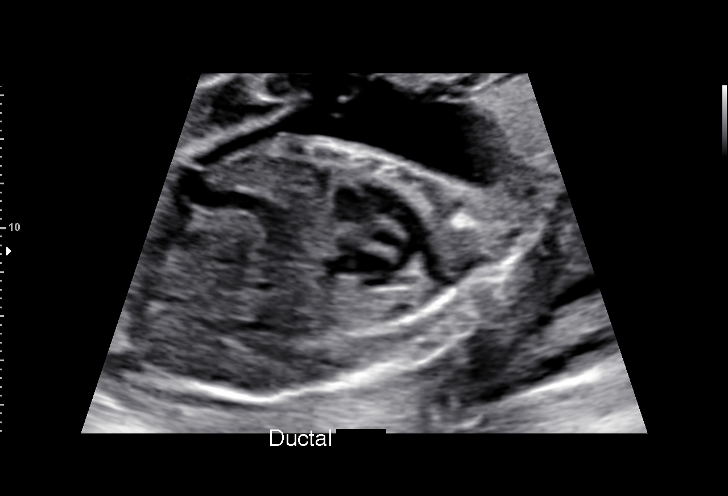
[im 37/48]
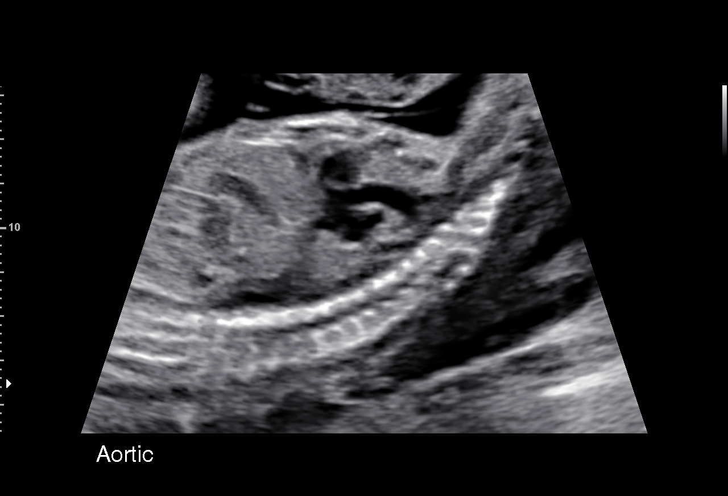
[im 41/48]
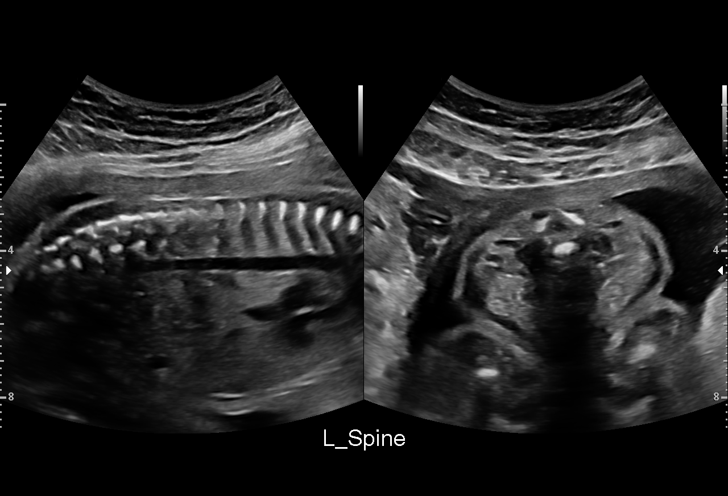
[im 44/48]
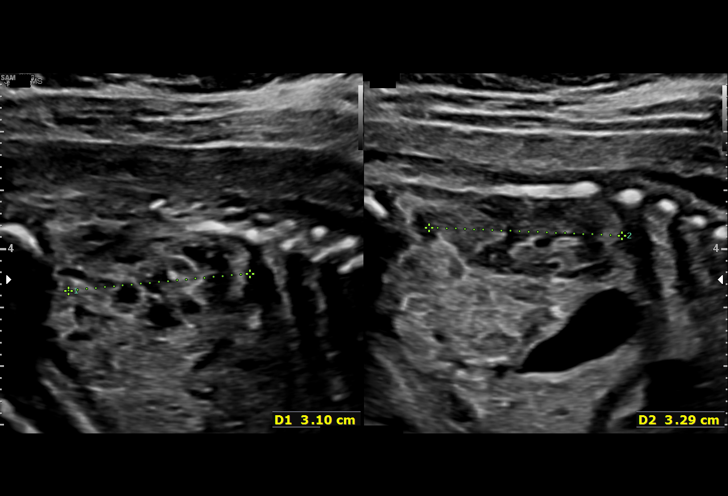
[im 48/48]
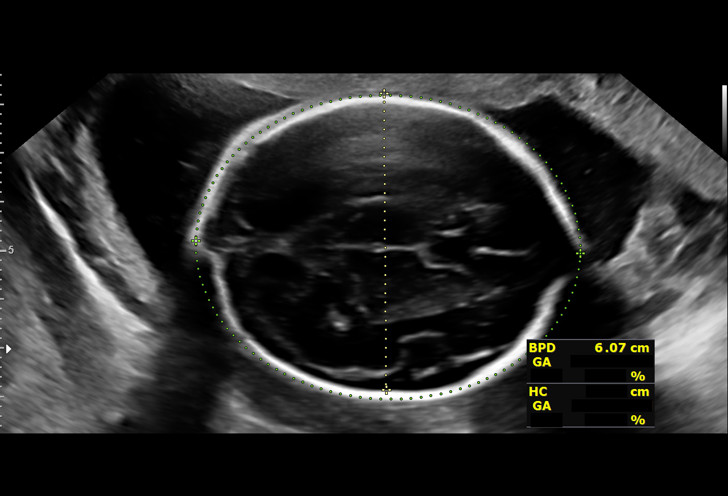

[14 of 28 positions shown; findings below may reference images not displayed]

CNM

 ----------------------------------------------------------------------

 ----------------------------------------------------------------------
Indications

  Encounter for other antenatal screening
  follow-up
  Poor obstetrical history(Placenta Abruption)
  Previous cesarean delivery, antepartum
  24 weeks gestation of pregnancy
 ----------------------------------------------------------------------
Fetal Evaluation

 Num Of Fetuses:         1
 Fetal Heart Rate(bpm):  134
 Cardiac Activity:       Observed
 Presentation:           Cephalic
 Placenta:               Anterior
 P. Cord Insertion:      Visualized

 Amniotic Fluid
 AFI FV:      Within normal limits

                             Largest Pocket(cm)

Biometry

 BPD:        61  mm     G. Age:  24w 6d         63  %    CI:        72.05   %    70 - 86
                                                         FL/HC:      19.9   %    18.7 -
 HC:      228.7  mm     G. Age:  24w 6d         55  %    HC/AC:      1.08        1.05 -
 AC:      211.7  mm     G. Age:  25w 5d         83  %    FL/BPD:     74.6   %    71 - 87
 FL:       45.5  mm     G. Age:  25w 0d         62  %    FL/AC:      21.5   %    20 - 24

 LV:        3.2  mm

 Est. FW:     798  gm    1 lb 12 oz      86  %
OB History
 Gravidity:    3         Term:   2
 Living:       2
Gestational Age

 U/S Today:     25w 1d                                        EDD:   11/06/19
 Best:          24w 2d     Det. By:  Previous Ultrasound      EDD:   11/12/19
                                     (03/28/19)
Anatomy

 Cranium:               Appears normal         Aortic Arch:            Appears normal
 Cavum:                 Appears normal         Ductal Arch:            Appears normal
 Ventricles:            Appears normal         Diaphragm:              Appears normal
 Choroid Plexus:        Previously seen        Stomach:                Appears normal, left
                                                                       sided
 Cerebellum:            Previously seen        Abdomen:                Appears normal
 Posterior Fossa:       Previously seen        Abdominal Wall:         Previously seen
 Nuchal Fold:           Not applicable (>20    Cord Vessels:           Previously seen
                        wks GA)
 Face:                  orbits prev seen;      Kidneys:                Appear normal
                        profile nml
 Lips:                  Appears normal         Bladder:                Previously seen
 Thoracic:              Appears normal         Spine:                  Appears normal
 Heart:                 Previously seen        Upper Extremities:      Previously seen
 RVOT:                  Previously seen        Lower Extremities:      Previously seen
 LVOT:                  Previously seen
Cervix Uterus Adnexa

 Cervix
 Length:           4.25  cm.
 Normal appearance by transabdominal scan.
Impression

 Patient returned for completion of fetal anatomy. Fetal
 biometry is consistent with her previously-established dates.
 Amniotic fluid is normal and good fetal activity is seen. Fetal
 anatomical survey was completed and appears normal.

 Patient reports she had emergency cesarean delivery and a
 previous pregnancy following fetal heart rate concerns.  She
 was told by the provider that she had placental abruption.
 Patient did not have blood transfusions.  She did not have
 hypertension nor she was a smoker.
Recommendations

 Follow-up scans as clinically indicated.
                 Gunda, Miso

## 2021-07-16 ENCOUNTER — Encounter: Payer: Self-pay | Admitting: Family Medicine

## 2021-07-16 ENCOUNTER — Ambulatory Visit (INDEPENDENT_AMBULATORY_CARE_PROVIDER_SITE_OTHER): Payer: Medicaid Other | Admitting: Family Medicine

## 2021-07-16 ENCOUNTER — Other Ambulatory Visit: Payer: Self-pay

## 2021-07-16 ENCOUNTER — Other Ambulatory Visit (HOSPITAL_COMMUNITY)
Admission: RE | Admit: 2021-07-16 | Discharge: 2021-07-16 | Disposition: A | Discharge status: Home / Self Care | Payer: Medicaid Other | Source: Ambulatory Visit | Attending: Family Medicine | Admitting: Family Medicine

## 2021-07-16 VITALS — BP 114/77 | HR 62 | Ht 64.0 in | Wt 140.7 lb

## 2021-07-16 DIAGNOSIS — Z113 Encounter for screening for infections with a predominantly sexual mode of transmission: Secondary | ICD-10-CM

## 2021-07-16 DIAGNOSIS — Z01419 Encounter for gynecological examination (general) (routine) without abnormal findings: Secondary | ICD-10-CM | POA: Diagnosis not present

## 2021-07-16 DIAGNOSIS — Z124 Encounter for screening for malignant neoplasm of cervix: Secondary | ICD-10-CM | POA: Diagnosis not present

## 2021-07-16 NOTE — Progress Notes (Signed)
? ?GYNECOLOGY OFFICE VISIT NOTE ? ?History:  ? Erica Hahn is a 31 y.o. [phone] here today for annual gyn visit. ? ?Wondering about method of tubal ligation ? ? ?Health Maintenance Due  ?Topic Date Due  ? Hepatitis C Screening  Never done  ? COVID-19 Vaccine (2 - Pfizer series) 01/17/2020  ? PAP-Cervical Cytology Screening  08/06/2020  ? PAP SMEAR-Modifier  08/06/2020  ? INFLUENZA VACCINE  Never done  ? ? ?Past Medical History:  ?Diagnosis Date  ? PID (acute pelvic inflammatory disease)   ? Scoliosis   ? ? ?Past Surgical History:  ?Procedure Laterality Date  ? BACK SURGERY    ? CESAREAN SECTION    ? CESAREAN SECTION N/A 11/17/2019  ? Procedure: CESAREAN SECTION;  Surgeon: Osborne Oman, MD;  Location: [facility];  Service: Obstetrics;  Laterality: N/A;  ? TUBAL LIGATION Bilateral 11/17/2019  ? Procedure: BILATERAL TUBAL LIGATION;  Surgeon: Osborne Oman, MD;  Location: [facility];  Service: Obstetrics;  Laterality: Bilateral;  ? ? ?The following portions of the patient's history were reviewed and updated as appropriate: allergies, current medications, past family history, past medical history, past social history, past surgical history and problem list.  ? ?Health Maintenance:   ?Last pap: ?Lab Results  ?Component Value Date  ? DIAGPAP  08/06/2017  ?  NEGATIVE FOR INTRAEPITHELIAL LESIONS OR MALIGNANCY.  ? ?due ? ?Last mammogram:  ?N/a  ? ? ?Review of Systems:  ?Pertinent items noted in HPI and remainder of comprehensive ROS otherwise negative. ? ?Physical Exam:  ?BP 114/77   Pulse 62   Ht 5\' 4"  (1.626 m)   Wt 140 lb 11.2 oz (63.8 kg)   LMP 06/24/2021 (Exact Date) Comment: BTL 2021  Breastfeeding No   BMI 24.15 kg/m?  ?CONSTITUTIONAL: Well-developed, well-nourished female in no acute distress.  ?HEENT:  Normocephalic, atraumatic. External right and left ear normal. No scleral icterus.  ?NECK: Normal range of motion, supple, no masses noted on observation ?SKIN: No rash noted. Not diaphoretic. No  erythema. No pallor. ?MUSCULOSKELETAL: Normal range of motion. No edema noted. ?NEUROLOGIC: Alert and oriented to person, place, and time. Normal muscle tone coordination.  ?PSYCHIATRIC: Normal mood and affect. Normal behavior. Normal judgment and thought content. ?RESPIRATORY: Effort normal, no problems with respiration noted ?ABDOMEN: No masses noted. No other overt distention noted.   ?PELVIC: Normal appearing external genitalia; normal appearing vaginal mucosa and cervix.  No abnormal discharge noted.   ? ?Labs and Imaging ?No results found for this or any previous visit (from the past 168 hour(s)). ?No results found.    ?Assessment and Plan:  ? ?Problem List Items Addressed This Visit   ? ?  ? Other  ? Well woman exam - Primary  ?  Cancer screening: ?- Pap: collected today ?- Mammogram: n/a ?- Colonoscopy: n/a ? ?Contraception: ?S/p bilateral partial salpingectomy ? 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 ?  ? ?Vaccinations: ?- Flu: reports she has already had ?- HPV: will consider, given handout ?- COVID: s/p 2 dose initial series, declines boosters ? ?Metabolic: ?- DM: not indicated ?- Lipids: not indicated ?- HTN: PReE and HELLP in prior pregnancy, normotensive today ? ?ID: ?- STI: would like vaginal screening, declines serology ?  ?  ? ?Other Visit Diagnoses   ? ? Screening for cervical cancer      ? Relevant  Orders  ? Cytology - PAP( [place])  ? Screening examination for sexually transmitted disease      ? Relevant Orders  ? Cytology - PAP( [place])  ? ?  ? ? ?Routine preventative health maintenance measures emphasized. ?Please refer to After Visit Summary for other counseling recommendations.  ? ?Return in about 1 year (around 07/17/2022) for Annual Wellness Visit.   ? ?Total face-to-face time with patient: 25 minutes.  Over 50% of encounter was spent on counseling and coordination of  care. ? ? ?Clarnce Flock, MD/MPH ?Attending Family Medicine Physician, Faculty Practice ?[facility] ? ?

## 2021-07-16 NOTE — Assessment & Plan Note (Signed)
Cancer screening: ?- Pap: collected today ?- Mammogram: n/a ?- Colonoscopy: n/a ? ?Contraception: ?S/p bilateral partial salpingectomy ? 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 ?  ? ? ?Vaccinations: ?- Flu: reports she has already had ?- HPV: will consider, given handout ?- COVID: s/p 2 dose initial series, declines boosters ? ?Metabolic: ?- DM: not indicated ?- Lipids: not indicated ?- HTN: PReE and HELLP in prior pregnancy, normotensive today ? ?ID: ?- STI: would like vaginal screening, declines serology ?

## 2021-07-22 ENCOUNTER — Telehealth: Payer: Self-pay

## 2021-07-22 LAB — CYTOLOGY - PAP
Chlamydia: NEGATIVE
Comment: NEGATIVE
Comment: NEGATIVE
Comment: NEGATIVE
Comment: NEGATIVE
Comment: NORMAL
Diagnosis: UNDETERMINED — AB
HPV 16: NEGATIVE
HPV 18 / 45: NEGATIVE
High risk HPV: POSITIVE — AB
Neisseria Gonorrhea: NEGATIVE
Trichomonas: NEGATIVE

## 2021-07-22 NOTE — Telephone Encounter (Signed)
[place] office called patient to schedule colposcopy. Patient requests results. Call transferred to clinical staff. PAP smear results given. Encouraged pt to review MyChart message from provider. ?

## 2021-09-09 ENCOUNTER — Ambulatory Visit: Payer: Medicaid Other | Admitting: Obstetrics & Gynecology

## 2021-10-03 ENCOUNTER — Encounter: Payer: Self-pay | Admitting: Family Medicine

## 2022-01-20 ENCOUNTER — Ambulatory Visit
Admission: RE | Admit: 2022-01-20 | Discharge: 2022-01-20 | Disposition: A | Discharge status: Home / Self Care | Payer: Medicaid Other | Source: Ambulatory Visit | Attending: Emergency Medicine | Admitting: Emergency Medicine

## 2022-01-20 ENCOUNTER — Other Ambulatory Visit: Payer: Self-pay

## 2022-01-20 ENCOUNTER — Ambulatory Visit: Payer: Medicaid Other | Admitting: Obstetrics and Gynecology

## 2022-01-20 VITALS — BP 107/74 | HR 66 | Temp 98.8°F | Resp 18

## 2022-01-20 DIAGNOSIS — N61 Mastitis without abscess: Secondary | ICD-10-CM | POA: Diagnosis not present

## 2022-01-20 MED ORDER — SULFAMETHOXAZOLE-TRIMETHOPRIM 800-160 MG PO TABS: 1.0000 | ORAL_TABLET | Freq: Two times a day (BID) | ORAL | 0 refills | Status: AC

## 2022-01-20 NOTE — ED Provider Notes (Signed)
[facility]    CSN: [id number] Arrival date & time: 01/20/22  6283    HISTORY   Chief Complaint  Patient presents with   nipple piercing infection   Appointment    8:30   HPI Erica Hahn is a pleasant, 31 y.o. female who presents to urgent care today. 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  Patient reports everything has been healing well until yesterday.  Its that her right nipple piercing is painful, has swelling, redness and is warm to touch per patient with crusty drainage.  Patient states she has not tried any treatments with exception of tylenol last night which did not alleviate her symptoms.  Patient denies fever, left nipple pain or swelling.  The history is provided by the patient.   Past Medical History:  Diagnosis Date   PID (acute pelvic inflammatory disease)    Scoliosis    Patient Active Problem List   Diagnosis Date Noted   Well woman exam 07/16/2021   Preeclampsia in postpartum period 11/23/2019   HELLP (hemolytic anemia/elev liver enzymes/low platelets in pregnancy), unspecified trimester 11/22/2019   Post-dates pregnancy 11/17/2019   Status post bilateral salpingectomy 11/17/2019   Sciatica of left side without back pain 09/23/2019   History of C-section 05/13/2019   History of placental abruption 05/13/2019   History of fusion of spine for scoliosis 11/02/2018   S/P emergency C-section 07/29/2016   Past Surgical History:  Procedure Laterality Date   BACK SURGERY     CESAREAN SECTION     CESAREAN SECTION N/A 11/17/2019   Procedure: CESAREAN SECTION;  Surgeon: Osborne Oman, MD;  Location: [facility];  Service: Obstetrics;  Laterality: N/A;   TUBAL LIGATION Bilateral 11/17/2019   Procedure: BILATERAL TUBAL LIGATION;  Surgeon: Osborne Oman, MD;  Location: [facility];  Service: Obstetrics;  Laterality: Bilateral;   OB History     Gravida  3   Para  3   Term  3    Preterm  0   AB  0   Living  3      SAB  0   IAB  0   Ectopic  0   Multiple  0   Live Births  3        Obstetric Comments  Has rods in spine and had to have spinal for delivery last time.          Home Medications    Prior to Admission medications   Medication Sig Start Date End Date Taking? Authorizing Provider  Prenatal Vit-Fe Fumarate-FA (MULTIVITAMIN-PRENATAL) 27-0.8 MG TABS tablet Take 1 tablet by mouth daily at 12 noon.    [provider]    Family History Family History  Problem Relation Age of Onset   Hypertension Mother    Hypertension Father    Social History Social History   Tobacco Use   Smoking status: Never   Smokeless tobacco: Never  Vaping Use   Vaping Use: Never used  Substance Use Topics   Alcohol use: Yes    Alcohol/week: 1.0 standard drink of alcohol    Types: 1 Glasses of wine per week   Drug use: Not Currently   Allergies   Patient has no known allergies.  Review of Systems Review of Systems Pertinent findings revealed after performing a 14 point review of systems has been noted in the history of present illness.  Physical Exam Triage Vital Signs  ED Triage Vitals  Enc Vitals Group     BP 03/01/21 0827 (!) 147/82     Pulse Rate 03/01/21 0827 72     Resp 03/01/21 0827 18     Temp 03/01/21 0827 98.3 F (36.8 C)     Temp Source 03/01/21 0827 Oral     SpO2 03/01/21 0827 98 %     Weight --      Height --      Head Circumference --      Peak Flow --      Pain Score 03/01/21 0826 5     Pain Loc --      Pain Edu? --      Excl. in GC? --   No data found.  Updated Vital Signs BP 107/74 (BP Location: Left Arm)   Pulse 66   Temp 98.8 F (37.1 C) (Oral)   Resp 18   LMP 12/23/2021   SpO2 97%   Physical Exam Vitals and nursing note reviewed.  Constitutional:      General: She is not in acute distress.    Appearance: Normal appearance.  HENT:     Head: Normocephalic and atraumatic.  Eyes:     Pupils:  Pupils are equal, round, and reactive to light.  Cardiovascular:     Rate and Rhythm: Normal rate and regular rhythm.  Pulmonary:     Effort: Pulmonary effort is normal.     Breath sounds: Normal breath sounds.  Chest:  Breasts:    Tanner Score is 5.     Breasts are symmetrical.     Right: Swelling, nipple discharge and tenderness (Erythema) present.     Left: Normal.    Musculoskeletal:        General: Normal range of motion.     Cervical back: Normal range of motion and neck supple.  Lymphadenopathy:     Upper Body:     Right upper body: No supraclavicular, axillary or pectoral adenopathy.     Left upper body: No supraclavicular, axillary or pectoral adenopathy.  Skin:    General: Skin is warm and dry.  Neurological:     General: No focal deficit present.     Mental Status: She is alert and oriented to person, place, and time. Mental status is at baseline.  Psychiatric:        Mood and Affect: Mood normal.        Behavior: Behavior normal.        Thought Content: Thought content normal.        Judgment: Judgment normal.     Visual Acuity Right Eye Distance:   Left Eye Distance:   Bilateral Distance:    Right Eye Near:   Left Eye Near:    Bilateral Near:     UC Couse / Diagnostics / Procedures:     Radiology No results found.  Procedures Procedures (including critical care time) EKG  Pending results:  Labs Reviewed - No data to display  Medications Ordered in UC: Medications - No data to display  UC Diagnoses / Final Clinical Impressions(s)   I have reviewed the triage vital signs and the nursing notes.  Pertinent labs & imaging results that were available during my care of the patient were reviewed by me and considered in my medical decision making (see chart for details).    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   Patient provided with a 10-day course of Bactrim to resolve the infection in her right breast.  Return precautions advised.  ED Prescriptions     Medication Sig Dispense Auth. Provider   sulfamethoxazole-trimethoprim (BACTRIM DS) 800-160 MG tablet Take 1 tablet by mouth 2 (two) times daily for 10 days. 20 tablet Theadora Rama Scales, PA-C      PDMP not reviewed this encounter.  Pending results:  Labs Reviewed - No data to display  Discharge Instructions:   Discharge Instructions      To treat the infection in your right breast, please begin Bactrim 1 tablet twice daily for the next 10 days.  Please remove the stud from your nipple while you are being treated.  If you would like to place a piece of sterile tubing where the stud was while you are being treated, to keep the piercing open, please feel free to do so.  Please follow-up if you do not have resolution of the infection after 10 days of antibiotics.  Thank you for visiting urgent care today.      Disposition Upon Discharge:  Condition: stable for discharge home  Patient presented with an acute illness with associated systemic symptoms and significant discomfort requiring urgent management. In my opinion, this is a condition that a prudent lay person (someone who possesses an average knowledge of health and medicine) may potentially expect to result in complications if not addressed urgently such as respiratory distress, impairment of bodily function or dysfunction of bodily organs.   Routine symptom specific, illness specific and/or disease specific instructions were discussed with the patient and/or caregiver at length.   As such, the patient has been evaluated and assessed, work-up was performed and treatment was provided in alignment with urgent care protocols and evidence based medicine.  Patient/parent/caregiver has been advised that the patient may require follow up for further testing and treatment if the symptoms  continue in spite of treatment, as clinically indicated and appropriate.  Patient/parent/caregiver has been advised to return to the [facility] or PCP if no better; to PCP or the Emergency Department if new signs and symptoms develop, or if the current signs or symptoms continue to change or worsen for further workup, evaluation and treatment as clinically indicated and appropriate  The patient will follow up with their current PCP if and as advised. If the patient does not currently have a PCP we will assist them in obtaining one.   The patient may need specialty follow up if the symptoms continue, in spite of conservative treatment and management, for further workup, evaluation, consultation and treatment as clinically indicated and appropriate.   Patient/parent/caregiver verbalized understanding and agreement of plan as discussed.  All questions were addressed during visit.  Please see discharge instructions below for further details of plan.  This office note has been dictated using Teaching laboratory technician.  Unfortunately, this method of dictation can sometimes lead to typographical or grammatical errors.  I apologize for your inconvenience in advance if this occurs.  Please do not hesitate to reach out to me if clarification is needed.      Theadora Rama Scales, [place] 01/20/22 [phone]

## 2022-01-20 NOTE — Discharge Instructions (Signed)
To treat the infection in your right breast, please begin Bactrim 1 tablet twice daily for the next 10 days.  Please remove the stud from your nipple while you are being treated.  If you would like to place a piece of sterile tubing where the stud was while you are being treated, to keep the piercing open, please feel free to do so.  Please follow-up if you do not have resolution of the infection after 10 days of antibiotics.  Thank you for visiting urgent care today.

## 2022-01-20 NOTE — ED Triage Notes (Signed)
Reports nipple piercing's obtained 2 weeks ago.  Patient reports everything has been fine until yesterday.  Right nipple piercing is painful, has swelling and warm to touch per patient and crusty drainage.  Has not tried any treatments with exception of tylenol last night

## 2022-03-04 ENCOUNTER — Encounter: Payer: Self-pay | Admitting: Family Medicine

## 2022-09-24 NOTE — Progress Notes (Deleted)
ANNUAL EXAM Patient name: Erica Hahn MRN [id number]  Date of birth: 22-Aug-1990 Chief Complaint:   No chief complaint on file.  History of Present Illness:   Erica Hahn is a 32 y.o. G3P3003 being seen today for a routine annual exam.  Current complaints: ***  Menstrual concerns? {yes/no:20286}   Breast or nipple changes? {yes/no:20286}  Contraception use? {yes/no:20286}  Sexually active? {yes/no:20286}   No LMP recorded.   The pregnancy intention screening data noted above was reviewed. Potential methods of contraception were discussed. The patient elected to proceed with No data recorded.   Last pap     Component Value Date/Time   DIAGPAP (A) 07/16/2021 0950    - Atypical squamous cells of undetermined significance (ASC-US)   DIAGPAP  08/06/2017 0000    NEGATIVE FOR INTRAEPITHELIAL LESIONS OR MALIGNANCY.   HPVHIGH Positive (A) 07/16/2021 0950   ADEQPAP  07/16/2021 0950    Satisfactory for evaluation; transformation zone component PRESENT.   ADEQPAP  08/06/2017 0000    Satisfactory for evaluation  endocervical/transformation zone component ABSENT.    High Risk HPV: Positive  Adequacy:  Satisfactory for evaluation, transformation zone component PRESENT  Diagnosis:  Atypical squamous cells of undetermined significance (ASC-US)  Last mammogram: ***. Results were: {normal, abnormal, n/a:23837}. Family h/o breast cancer: {yes***/no:23838} Last colonoscopy: ***. Results were: {normal, abnormal, n/a:23837}. Family h/o colorectal cancer: {yes***/no:23838}     07/16/2021    9:15 AM 08/19/2019    8:46 AM 05/13/2019   10:03 AM 01/12/2019    9:19 AM 10/11/2018    3:32 PM  Depression screen PHQ 2/9  Decreased Interest 1 1 2  0 0  Down, Depressed, Hopeless 1 0 1 0 0  PHQ - 2 Score 2 1 3  0 0  Altered sleeping 0 3 3    Tired, decreased energy 1 2 3     Change in appetite 1 1 3     Feeling bad or failure about yourself  1 0 1    Trouble concentrating 0 0 0    Moving  slowly or fidgety/restless 0 0 0    Suicidal thoughts 0 0 0    PHQ-9 Score 5 7 13     Difficult doing work/chores Not difficult at all Not difficult at all Not difficult at all          07/16/2021    9:15 AM 08/19/2019    8:46 AM 05/13/2019   10:04 AM 01/12/2019    9:20 AM  GAD 7 : Generalized Anxiety Score  Nervous, Anxious, on Edge 1 0 1 0  Control/stop worrying 0 0 1 0  Worry too much - different things 1 0 1 0  Trouble relaxing 0 1 1 0  Restless 0 0 0 0  Easily annoyed or irritable 3 1 1  0  Afraid - awful might happen 1 0 0 0  Total GAD 7 Score 6 2 5  0  Anxiety Difficulty Not difficult at all Not difficult at all Not difficult at all Not difficult at all     Review of Systems:   Pertinent items are noted in HPI Denies any headaches, blurred vision, fatigue, shortness of breath, chest pain, abdominal pain, abnormal vaginal discharge/itching/odor/irritation, problems with periods, bowel movements, urination, or intercourse unless otherwise stated above. Pertinent History Reviewed:  Reviewed past medical,surgical, social and family history.  Reviewed problem list, medications and allergies. Physical Assessment:  There were no vitals filed for this visit.There is no height or weight on file to calculate  BMI.        Physical Examination:   General appearance - well appearing, and in no distress  Mental status - alert, oriented to person, place, and time  Psych:  She has a normal mood and affect  Skin - warm and dry, normal color, no suspicious lesions noted  Chest - effort normal, all lung fields clear to auscultation bilaterally  Heart - normal rate and regular rhythm  Breasts - breasts appear normal, no suspicious masses, no skin or nipple changes or  axillary nodes  Abdomen - soft, nontender, nondistended, no masses or organomegaly  Pelvic -  VULVA: normal appearing vulva with no masses, tenderness or lesions   VAGINA: normal appearing vagina with normal color and discharge,  no lesions   CERVIX: normal appearing cervix without discharge or lesions, no CMT  Thin prep pap is {Desc; done/not:10129} *** HR HPV cotesting  UTERUS: uterus is felt to be normal size, shape, consistency and nontender   ADNEXA: No adnexal masses or tenderness noted.  Extremities:  No swelling or varicosities noted  Chaperone present for exam  No results found for this or any previous visit (from the past 24 hour(s)).    Assessment & Plan:  There are no diagnoses linked to this encounter.  Labs/procedures today: ***  Mammogram: {Mammo f/u:25212::"@ 30yo"}, or sooner if problems Colonoscopy: {TCS f/u:25213::"@ 30yo"}, or sooner if problems  No orders of the defined types were placed in this encounter.   Meds: No orders of the defined types were placed in this encounter.   Follow-up: No follow-ups on file.  Lorriane Shire, MD 09/24/2022 4:41 PM

## 2022-09-25 ENCOUNTER — Encounter: Payer: Self-pay | Admitting: Obstetrics and Gynecology

## 2022-09-25 ENCOUNTER — Ambulatory Visit: Payer: Medicaid Other | Admitting: Obstetrics and Gynecology

## 2022-11-28 ENCOUNTER — Encounter: Payer: Self-pay | Admitting: Family Medicine

## 2024-02-02 ENCOUNTER — Ambulatory Visit: Admitting: Obstetrics and Gynecology

## 2024-02-11 ENCOUNTER — Ambulatory Visit (INDEPENDENT_AMBULATORY_CARE_PROVIDER_SITE_OTHER): Admitting: Primary Care

## 2024-03-21 ENCOUNTER — Ambulatory Visit (INDEPENDENT_AMBULATORY_CARE_PROVIDER_SITE_OTHER): Admitting: Obstetrics and Gynecology

## 2024-03-21 ENCOUNTER — Other Ambulatory Visit (HOSPITAL_COMMUNITY)
Admission: RE | Admit: 2024-03-21 | Discharge: 2024-03-21 | Disposition: A | Discharge status: Home / Self Care | Source: Ambulatory Visit | Attending: Obstetrics and Gynecology | Admitting: Obstetrics and Gynecology

## 2024-03-21 ENCOUNTER — Other Ambulatory Visit: Payer: Self-pay

## 2024-03-21 ENCOUNTER — Encounter: Payer: Self-pay | Admitting: Obstetrics and Gynecology

## 2024-03-21 VITALS — BP 118/84 | HR 77 | Wt 156.3 lb

## 2024-03-21 DIAGNOSIS — Z01419 Encounter for gynecological examination (general) (routine) without abnormal findings: Secondary | ICD-10-CM | POA: Diagnosis not present

## 2024-03-21 DIAGNOSIS — R87619 Unspecified abnormal cytological findings in specimens from cervix uteri: Secondary | ICD-10-CM

## 2024-03-21 DIAGNOSIS — Z803 Family history of malignant neoplasm of breast: Secondary | ICD-10-CM | POA: Diagnosis not present

## 2024-03-21 NOTE — Progress Notes (Signed)
   ANNUAL EXAM Patient name: Erica Hahn MRN [id number]  Date of birth: [birth year] Chief Complaint:   Gynecologic Exam  History of Present Illness:   Erica Hahn is a 33 y.o. [phone] female being seen today for a routine annual exam.   Current concerns: None  Current birth control: b/l salpingectomy  She is married to spouse since 2021. Neither of them with new partners since 2018. Met in HS.   Patient's last menstrual period was 02/26/2024.  Gardasil: Did not yet have.  Last Pap/Pap History:  H/O abnormal pap: yes 2017: Normal pap 2019: Normal pap 07/2021: ASCUS/HPV pos, non 16/18  Review of Systems:   Pertinent items are noted in HPI Denies any headaches, blurred vision, fatigue, shortness of breath, chest pain, abdominal pain, abnormal vaginal discharge/itching/odor/irritation, problems with periods, bowel movements, urination, or intercourse unless otherwise stated above.  Pertinent History Reviewed:  Reviewed past medical,surgical, social and family history.  Reviewed problem list, medications and allergies. Physical Assessment:   Vitals:   03/21/24 0957  BP: 118/84  Pulse: 77  Weight: 156 lb 4.8 oz (70.9 kg)  Body mass index is 26.83 kg/m.   Physical Examination:  General appearance - well appearing, and in no distress Mental status - alert, oriented to person, place, and time Psych:  She has a normal mood and affect Skin - warm and dry, normal color, no suspicious lesions noted Chest - effort normal Heart - normal rate  Breasts - breasts appear normal, no suspicious masses, no skin or nipple changes or axillary nodes Abdomen - soft, nontender, nondistended, no masses or organomegaly Pelvic -  Performed and: VULVA: normal appearing vulva with no masses, tenderness or lesions VAGINA: normal appearing vagina with normal color and discharge, no lesions CERVIX: normal appearing cervix without discharge or lesions, no CMT. UTERUS: Not examined ADNEXA:  Not examined Extremities:  No swelling or varicosities noted  Chaperone present for exam  No results found for this or any previous visit (from the past 24 hours).  Assessment & Plan:  Diagnoses and all orders for this visit:  Encounter for annual routine gynecological examination - Cervical cancer screening: Discussed guidelines. Pap with HPV done - Gardasil: has not yet had. Will provide information - GC/CT: not indicated - Birth Control: Salpingectomy - Breast Health: Encouraged self breast awareness/SBE. Teaching provided.  - F/U 12 months and prn  Abnormal cervical Papanicolaou smear, unspecified abnormal pap finding - Cotesting today  Family History of Breast Cancer - She will check with mom of timing of great aunt's breast cancer. For now, we placed 50 in the chart based on her passing at age 73. Discussed mxr at age 30.  - Discussed option for genetic testing but ideally would start with her mother. If mom won't/can't do the testing, then I would have her tested. Reviewed option for possible [employer] participation and alternatively to do it through our office through insurance.  - She will consider and let me know via MyChart.    No orders of the defined types were placed in this encounter.   Meds: No orders of the defined types were placed in this encounter.   Follow-up: Return in about 1 year (around 03/21/2025) for annual.  Vina Solian, MD 03/21/2024 10:39 AM

## 2024-03-21 NOTE — Patient Instructions (Signed)
 [email or web address]

## 2024-03-23 ENCOUNTER — Ambulatory Visit: Payer: Self-pay | Admitting: Obstetrics and Gynecology

## 2024-03-23 DIAGNOSIS — R87619 Unspecified abnormal cytological findings in specimens from cervix uteri: Secondary | ICD-10-CM

## 2024-03-23 LAB — CYTOLOGY - PAP
Comment: NEGATIVE
Diagnosis: NEGATIVE
High risk HPV: NEGATIVE

## 2024-04-22 ENCOUNTER — Telehealth (INDEPENDENT_AMBULATORY_CARE_PROVIDER_SITE_OTHER): Payer: Self-pay | Admitting: Primary Care

## 2024-04-22 NOTE — Telephone Encounter (Signed)
 Called pt to make aware of appt details. Pt did not answer and could not LVM

## 2024-04-25 ENCOUNTER — Ambulatory Visit (INDEPENDENT_AMBULATORY_CARE_PROVIDER_SITE_OTHER): Admitting: Primary Care

## 2024-04-25 VITALS — BP 116/78 | HR 73 | Resp 16 | Ht 64.0 in | Wt 153.6 lb

## 2024-04-25 DIAGNOSIS — E559 Vitamin D deficiency, unspecified: Secondary | ICD-10-CM

## 2024-04-25 DIAGNOSIS — F32A Depression, unspecified: Secondary | ICD-10-CM | POA: Diagnosis not present

## 2024-04-25 DIAGNOSIS — R5383 Other fatigue: Secondary | ICD-10-CM

## 2024-04-25 DIAGNOSIS — R051 Acute cough: Secondary | ICD-10-CM

## 2024-04-25 DIAGNOSIS — N920 Excessive and frequent menstruation with regular cycle: Secondary | ICD-10-CM | POA: Diagnosis not present

## 2024-04-25 DIAGNOSIS — Z7689 Persons encountering health services in other specified circumstances: Secondary | ICD-10-CM

## 2024-04-25 DIAGNOSIS — R0981 Nasal congestion: Secondary | ICD-10-CM | POA: Diagnosis not present

## 2024-04-25 MED ORDER — FLUTICASONE PROPIONATE 50 MCG/ACT NA SUSP: 2.0000 | Freq: Every day | NASAL | 6 refills | Status: AC

## 2024-04-25 NOTE — Progress Notes (Signed)
 "  New Patient Office Visit  Subjective    Patient ID: Erica Hahn female  DOB: 23-Sep-1990  Age: 33 y.o. MRN: [id number]   CC:   HPI     New Patient (Initial Visit)    Additional comments: Re-establish care  Pt is requesting blood work to check her vitamin levels  Few stressors at home  Pt states she has been having a feel that water is in her ears not painful has been present for a few months   Pt is requesting covid, flu and rsv test. Pt states she was feeling under the weather the past week       Last edited by Casimir Juvenal SAUNDERS, RMA on 04/25/2024  8:40 AM.      HPI  Erica Hahn is a 33 year old female gravida 3 , 3 children ,birth control tubal ligation heterosexual married normal weight.  In today to reestablish care.  Preeclampsia during pregnancy blood pressure has remained normal since delivery. Patient recently experienced signs and symptoms of a upper respiratory infection to include coughing sore throat hoarseness and migraines.  This lasted approximately a week.  No one else in the house had the symptoms.  Denies fever or shortness of breath.  Will check WBC for history of present infection.  Due to the time.  Of signs and symptoms COVID, flu, RSV are out of timeframe.  Patient presents to  Medications Ordered Prior to Encounter[1]   Allergies[2]  Past Medical History:  Diagnosis Date   Abdominal pain during pregnancy in third trimester 06/30/2016   Acute renal failure (ARF) 12/04/2019   Anemia affecting pregnancy 06/30/2016   Cesarean delivery due to maternal disorder, delivered, current hospitalization 06/30/2016   PID (acute pelvic inflammatory disease)    Placenta abruption, delivered, current hospitalization 06/30/2016   Pneumonia due to COVID-19 virus 12/04/2019   Positive D dimer 12/04/2019   S/P cesarean section 12/04/2019   Scoliosis      Past Surgical History:  Procedure Laterality Date   BACK SURGERY     CESAREAN SECTION      CESAREAN SECTION N/A 11/17/2019   Procedure: CESAREAN SECTION;  Surgeon: Herchel Gloris LABOR, MD;  Location: [facility];  Service: Obstetrics;  Laterality: N/A;   TUBAL LIGATION Bilateral 11/17/2019   Procedure: BILATERAL TUBAL LIGATION;  Surgeon: Herchel Gloris LABOR, MD;  Location: [facility];  Service: Obstetrics;  Laterality: Bilateral;     Family History  Problem Relation Age of Onset   Hypertension Mother    Hypertension Father    Cancer - Ovarian Maternal Grandmother    Breast cancer Maternal Aunt 67       Estimate for age, passed at 46 from cancer, maternal great aunt    Social History   Socioeconomic History   Marital status: Married    Spouse name: Not on file   Number of children: 2   Years of education: Not on file   Highest education level: 12th grade  Occupational History   Not on file  Tobacco Use   Smoking status: Never   Smokeless tobacco: Never  Vaping Use   Vaping status: Never Used  Substance and Sexual Activity   Alcohol use: Yes    Alcohol/week: 1.0 standard drink of alcohol    Types: 1 Glasses of wine per week   Drug use: Not Currently   Sexual activity: Not Currently    Partners: Male    Birth control/protection: Surgical  Other Topics Concern  Not on file  Social History Narrative   Not on file   Social Drivers of Health   Tobacco Use: Low Risk (04/25/2024)   Patient History    Smoking Tobacco Use: Never    Smokeless Tobacco Use: Never    Passive Exposure: Not on file  Financial Resource Strain: Low Risk (04/25/2024)   Overall Financial Resource Strain (CARDIA)    Difficulty of Paying Living Expenses: Not very hard  Food Insecurity: No Food Insecurity (04/25/2024)   Epic    Worried About Radiation Protection Practitioner of Food in the Last Year: Never true    Ran Out of Food in the Last Year: Never true  Transportation Needs: No Transportation Needs (04/25/2024)   Epic    Lack of Transportation (Medical): No    Lack of Transportation (Non-Medical): No   Physical Activity: Inactive (04/25/2024)   Exercise Vital Sign    Days of Exercise per Week: 0 days    Minutes of Exercise per Session: Not on file  Stress: No Stress Concern Present (04/25/2024)   [employer] of Occupational Health - Occupational Stress Questionnaire    Feeling of Stress: Not at all  Social Connections: Moderately Isolated (04/25/2024)   Social Connection and Isolation Panel    Frequency of Communication with Friends and Family: Once a week    Frequency of Social Gatherings with Friends and Family: Never    Attends Religious Services: 1 to 4 times per year    Active Member of Clubs or Organizations: No    Attends Engineer, Structural: Not on file    Marital Status: Married  Catering Manager Violence: Not on file  Depression (PHQ2-9): Low Risk (04/25/2024)   Depression (PHQ2-9)    PHQ-2 Score: 4  Recent Concern: Depression (PHQ2-9) - Medium Risk (03/21/2024)   Depression (PHQ2-9)    PHQ-2 Score: 5  Alcohol Screen: Low Risk (04/25/2024)   Alcohol Screen    Last Alcohol Screening Score (AUDIT): 2  Housing: Low Risk (04/25/2024)   Epic    Unable to Pay for Housing in the Last Year: No    Number of Times Moved in the Last Year: 0    Homeless in the Last Year: No  Utilities: Not on file  Health Literacy: Not on file    SDOH Interventions Today    Flowsheet Row Most Recent Value  SDOH Interventions   Depression Interventions/Treatment  Counseling     Health Maintenance  Topic Date Due   Hepatitis C Screening  Never done   Hepatitis B Vaccine (1 of 3 - 19+ 3-dose series) Never done   HPV Vaccine (1 - 3-dose SCDM series) Never done   COVID-19 Vaccine (1 - 2025-26 season) Never done   Flu Shot  08/02/2024*   Pap with HPV screening  03/21/2025   DTaP/Tdap/Td vaccine (2 - Td or Tdap) 08/18/2029   HIV Screening  Completed   Pneumococcal Vaccine  Aged Out   Meningitis B Vaccine  Aged Out  *Topic was postponed. The date shown is not the  original due date.    Objective    BP 116/78   Pulse 73   Resp 16   Ht 5' 4 (1.626 m)   Wt 153 lb 9.6 oz (69.7 kg)   SpO2 100%   BMI 26.37 kg/m  BP Readings from Last 3 Encounters:  04/25/24 116/78  03/21/24 118/84  01/20/22 107/74       Physical Exam Vitals reviewed.  Constitutional:  Appearance: Normal appearance. She is normal weight.  HENT:     Head: Normocephalic.     Right Ear: Tympanic membrane, ear canal and external ear normal.     Left Ear: Tympanic membrane, ear canal and external ear normal.     Nose: Congestion present.     Comments: Right > Left swollen red boggy turbinates    Mouth/Throat:     Mouth: Mucous membranes are moist.  Eyes:     Extraocular Movements: Extraocular movements intact.     Pupils: Pupils are equal, round, and reactive to light.  Cardiovascular:     Rate and Rhythm: Normal rate.  Pulmonary:     Effort: Pulmonary effort is normal.     Breath sounds: Normal breath sounds.  Abdominal:     General: Bowel sounds are normal.     Palpations: Abdomen is soft.  Musculoskeletal:        General: Normal range of motion.     Cervical back: Normal range of motion.  Skin:    General: Skin is warm and dry.  Neurological:     Mental Status: She is alert and oriented to person, place, and time.  Psychiatric:        Mood and Affect: Mood normal.        Behavior: Behavior normal.        Thought Content: Thought content normal.    Assessment & Plan:  Erica Hahn was seen today for new patient (initial visit).  Diagnoses and all orders for this visit:  Encounter to establish care  Menorrhagia with regular cycle -     CBC with Differential/Platelet  Acute cough Cont home remedies  -     CBC with Differential/Platelet -     CMP14+EGFR  Nasal congestion -     fluticasone  (FLONASE ) 50 MCG/ACT nasal spray; Place 2 sprays into both nostrils daily.  Vitamin D  deficiency -     VITAMIN D  25 Hydroxy (Vit-D Deficiency,  Fractures)   Fatigue due to depression  Social determinates - loss of interest can sleep  ( father is very supportive )  Considering therapy  TSH  Follow-up:    The above assessment and management plan was discussed with the patient. The patient verbalized understanding of and has agreed to the management plan. Patient is aware to call the clinic if symptoms fail to improve or worsen. Patient is aware when to return to the clinic for a follow-up visit. Patient educated on when it is appropriate to go to the emergency department.   Erica Bohr, NP-C     [1]  No current outpatient medications on file prior to visit.   No current facility-administered medications on file prior to visit.  [2] No Known Allergies  "

## 2024-04-26 LAB — CMP14+EGFR
ALT: 15 IU/L (ref 0–32)
AST: 14 IU/L (ref 0–40)
Albumin: 4.3 g/dL (ref 3.9–4.9)
Alkaline Phosphatase: 70 IU/L (ref 41–116)
BUN/Creatinine Ratio: 15 (ref 9–23)
BUN: 15 mg/dL (ref 6–20)
Bilirubin Total: 0.7 mg/dL (ref 0.0–1.2)
CO2: 22 mmol/L (ref 20–29)
Calcium: 9.6 mg/dL (ref 8.7–10.2)
Chloride: 105 mmol/L (ref 96–106)
Creatinine, Ser: 1.03 mg/dL — ABNORMAL HIGH (ref 0.57–1.00)
Globulin, Total: 3.2 g/dL (ref 1.5–4.5)
Glucose: 70 mg/dL (ref 70–99)
Potassium: 4.4 mmol/L (ref 3.5–5.2)
Sodium: 141 mmol/L (ref 134–144)
Total Protein: 7.5 g/dL (ref 6.0–8.5)
eGFR: 75 mL/min/1.73

## 2024-04-26 LAB — TSH+FREE T4
Free T4: 1.4 ng/dL (ref 0.82–1.77)
TSH: 0.997 u[IU]/mL (ref 0.450–4.500)

## 2024-04-26 LAB — VITAMIN D 25 HYDROXY (VIT D DEFICIENCY, FRACTURES): Vit D, 25-Hydroxy: 45.8 ng/mL (ref 30.0–100.0)

## 2024-04-26 LAB — CBC WITH DIFFERENTIAL/PLATELET
Basophils Absolute: 0.1 x10E3/uL (ref 0.0–0.2)
Basos: 1 %
EOS (ABSOLUTE): 0.2 x10E3/uL (ref 0.0–0.4)
Eos: 3 %
Hematocrit: 41.5 % (ref 34.0–46.6)
Hemoglobin: 13.5 g/dL (ref 11.1–15.9)
Immature Grans (Abs): 0 x10E3/uL (ref 0.0–0.1)
Immature Granulocytes: 0 %
Lymphocytes Absolute: 2 x10E3/uL (ref 0.7–3.1)
Lymphs: 28 %
MCH: 30 pg (ref 26.6–33.0)
MCHC: 32.5 g/dL (ref 31.5–35.7)
MCV: 92 fL (ref 79–97)
Monocytes Absolute: 0.5 x10E3/uL (ref 0.1–0.9)
Monocytes: 7 %
Neutrophils Absolute: 4.5 x10E3/uL (ref 1.4–7.0)
Neutrophils: 61 %
Platelets: 279 x10E3/uL (ref 150–450)
RBC: 4.5 x10E6/uL (ref 3.77–5.28)
RDW: 12.2 % (ref 11.7–15.4)
WBC: 7.3 x10E3/uL (ref 3.4–10.8)

## 2024-04-29 ENCOUNTER — Ambulatory Visit (INDEPENDENT_AMBULATORY_CARE_PROVIDER_SITE_OTHER): Payer: Self-pay | Admitting: Primary Care

## 2024-05-06 ENCOUNTER — Telehealth: Payer: Self-pay | Admitting: *Deleted

## 2024-05-06 NOTE — Telephone Encounter (Signed)
 Copied from CRM [id number]. Topic: General - Other >> May 04, 2024  2:19 PM Erica Hahn wrote: Reason for CRM: Patient is calling and stated she does want to start therapy as long as it's cover by her medicaid. Callback number [phone]

## 2024-05-16 ENCOUNTER — Telehealth (INDEPENDENT_AMBULATORY_CARE_PROVIDER_SITE_OTHER): Payer: Self-pay | Admitting: Primary Care

## 2024-05-16 NOTE — Telephone Encounter (Signed)
 Copied from CRM [id number]. Topic: General - Other >> May 04, 2024  2:19 PM Erica Hahn wrote: Reason for CRM: Patient is calling and stated she does want to start therapy as long as it's cover by her medicaid. Callback number [phone] >> May 16, 2024 11:27 AM [name] wrote: Patient is calling again regarding wanting to let Dr.Ewards know she does want to start therapy as long as it's covered by medicaid. Can she PLEASE call patient today! Callback number [phone]  >> May 09, 2024  2:40 PM [name] wrote: Calling back to check on this, she said she will give doc more time to get back with her

## 2024-05-18 NOTE — Telephone Encounter (Signed)
 Will forward to provider

## 2024-05-19 ENCOUNTER — Other Ambulatory Visit (INDEPENDENT_AMBULATORY_CARE_PROVIDER_SITE_OTHER): Payer: Self-pay | Admitting: Primary Care

## 2024-05-19 DIAGNOSIS — R5383 Other fatigue: Secondary | ICD-10-CM

## 2024-05-31 ENCOUNTER — Encounter: Payer: Self-pay | Admitting: Internal Medicine

## 2024-05-31 ENCOUNTER — Ambulatory Visit: Attending: Internal Medicine | Admitting: Internal Medicine

## 2024-05-31 VITALS — BP 109/73 | HR 84 | Temp 98.4°F | Ht 64.0 in | Wt 154.0 lb

## 2024-05-31 DIAGNOSIS — B9789 Other viral agents as the cause of diseases classified elsewhere: Secondary | ICD-10-CM

## 2024-05-31 DIAGNOSIS — J988 Other specified respiratory disorders: Secondary | ICD-10-CM

## 2024-05-31 LAB — POC COVID19/FLU A&B COMBO
Covid Antigen, POC: NEGATIVE
Influenza A Antigen, POC: NEGATIVE
Influenza B Antigen, POC: NEGATIVE

## 2024-05-31 NOTE — Patient Instructions (Signed)
" °  VISIT SUMMARY: You came in today with a cough, sore throat, and fever that started yesterday. Your tests for strep, COVID, and flu were negative, and it is likely that you have a viral infection such as the common cold or RSV.  YOUR PLAN: -ACUTE UPPER RESPIRATORY INFECTION: An acute upper respiratory infection is a viral infection that affects the nose, throat, and airways. Your tests for COVID, and flu were negative, so it is likely caused by one of the other common respiratory viruses. You should take acetaminophen  as needed for fever or body aches, use Robitussin DM for your cough, especially if it disturbs your sleep, and use throat lozenges or warm salt water gargles three to four times a day for the sore throat. Wear mask when out in public or around others until cough resolves. A work note has been provided for your absence through Saturday.  INSTRUCTIONS: Please follow the treatment plan and rest at home. If your symptoms worsen or do not improve, schedule a follow-up appointment.    Contains text generated by [employer].   "

## 2024-05-31 NOTE — Progress Notes (Signed)
 "   Patient ID: Erica Hahn, female    DOB: [birth year]  MRN: [id number]  CC: URI (Fever, chills, body aches, sore throat X2 days)   Subjective: Erica Hahn is a 34 y.o. female who presents for UC visit. Her chronic medical issues include:   Discussed the use of AI scribe software for clinical note transcription with the patient, who gave verbal consent to proceed.  History of Present Illness [name] is a 34 year old female who presents with cough, sore throat, and fever.  Symptoms began yesterday with a cough, followed by a sore throat and fever starting last night. The cough is productive with green mucus and causes a painful burning sensation in her throat. She reports feeling something in her chest when she coughs. No nasal congestion or shortness of breath. Her fever was not measured, but she felt hot and experienced chills. She has been using Tylenol  since last night to manage her symptoms. Did not receive flu vaccine and had only one COVID vaccine in the past. No recent long distance travel. She works as a nurse, mental health.  Two of her sons are also sick at home. Her twelve-year-old son has been unwell since the weekend without a fever, while her seven-year-old son developed a fever yesterday. Thinks both sons have had MMR vaccines.       Patient Active Problem List   Diagnosis Date Noted   Abnormal cervical Papanicolaou smear 03/21/2024   History of pre-eclampsia/HELLP Syndrome 11/23/2019   Status post bilateral salpingectomy 11/17/2019   Sciatica of left side without back pain 09/23/2019   History of C-section 05/13/2019   History of fusion of spine for scoliosis 11/02/2018     Medications Ordered Prior to Encounter[1]  Allergies[2]  Social History   Socioeconomic History   Marital status: Married    Spouse name: Not on file   Number of children: 2   Years of education: Not on file   Highest education level: 12th grade  Occupational  History   Not on file  Tobacco Use   Smoking status: Never   Smokeless tobacco: Never  Vaping Use   Vaping status: Never Used  Substance and Sexual Activity   Alcohol use: Yes    Alcohol/week: 1.0 standard drink of alcohol    Types: 1 Glasses of wine per week   Drug use: Not Currently   Sexual activity: Not Currently    Partners: Male    Birth control/protection: Surgical  Other Topics Concern   Not on file  Social History Narrative   Not on file   Social Drivers of Health   Tobacco Use: Low Risk (04/25/2024)   Patient History    Smoking Tobacco Use: Never    Smokeless Tobacco Use: Never    Passive Exposure: Not on file  Financial Resource Strain: Low Risk (04/25/2024)   Overall Financial Resource Strain (CARDIA)    Difficulty of Paying Living Expenses: Not very hard  Food Insecurity: No Food Insecurity (04/25/2024)   Epic    Worried About Radiation Protection Practitioner of Food in the Last Year: Never true    Ran Out of Food in the Last Year: Never true  Transportation Needs: No Transportation Needs (04/25/2024)   Epic    Lack of Transportation (Medical): No    Lack of Transportation (Non-Medical): No  Physical Activity: Inactive (04/25/2024)   Exercise Vital Sign    Days of Exercise per Week: 0 days    Minutes of Exercise per Session:  Not on file  Stress: No Stress Concern Present (04/25/2024)   [employer] of Occupational Health - Occupational Stress Questionnaire    Feeling of Stress: Not at all  Social Connections: Moderately Isolated (04/25/2024)   Social Connection and Isolation Panel    Frequency of Communication with Friends and Family: Once a week    Frequency of Social Gatherings with Friends and Family: Never    Attends Religious Services: 1 to 4 times per year    Active Member of Clubs or Organizations: No    Attends Engineer, Structural: Not on file    Marital Status: Married  Catering Manager Violence: Not on file  Depression (PHQ2-9): Low Risk  (04/25/2024)   Depression (PHQ2-9)    PHQ-2 Score: 4  Recent Concern: Depression (PHQ2-9) - Medium Risk (03/21/2024)   Depression (PHQ2-9)    PHQ-2 Score: 5  Alcohol Screen: Low Risk (04/25/2024)   Alcohol Screen    Last Alcohol Screening Score (AUDIT): 2  Housing: Low Risk (04/25/2024)   Epic    Unable to Pay for Housing in the Last Year: No    Number of Times Moved in the Last Year: 0    Homeless in the Last Year: No  Utilities: Not on file  Health Literacy: Not on file    Family History  Problem Relation Age of Onset   Hypertension Mother    Hypertension Father    Cancer - Ovarian Maternal Grandmother    Breast cancer Maternal Aunt 22       Estimate for age, passed at 27 from cancer, maternal great aunt    Past Surgical History:  Procedure Laterality Date   BACK SURGERY     CESAREAN SECTION     CESAREAN SECTION N/A 11/17/2019   Procedure: CESAREAN SECTION;  Surgeon: Herchel Gloris LABOR, MD;  Location: [facility];  Service: Obstetrics;  Laterality: N/A;   TUBAL LIGATION Bilateral 11/17/2019   Procedure: BILATERAL TUBAL LIGATION;  Surgeon: Herchel Gloris LABOR, MD;  Location: [facility];  Service: Obstetrics;  Laterality: Bilateral;    ROS: Review of Systems Negative except as stated above  PHYSICAL EXAM: BP 109/73 (BP Location: Left Arm, Patient Position: Sitting, Cuff Size: Normal)   Pulse 84   Temp 98.4 F (36.9 C) (Oral)   Ht 5' 4 (1.626 m)   Wt 154 lb (69.9 kg)   SpO2 98%   BMI 26.43 kg/m   Physical Exam  General appearance - young AAF, who appears tired but in NAD Mental status - normal mood, behavior, speech, dress, motor activity, and thought processes Eyes - no conjunctival injection Nose - normal and patent, no erythema, discharge or polyps Mouth - no oral lesions. Throat without erythema or exudates Neck - no cervical or preauricular LN Chest - clear to auscultation, no wheezes, rales or rhonchi, symmetric air entry Heart - normal rate, regular rhythm,  normal S1, S2, no murmurs, rubs, clicks or gallops  Results for orders placed or performed in visit on 05/31/24  POC Covid19/Flu A&B Antigen   Collection Time: 05/31/24 10:56 AM  Result Value Ref Range   Influenza A Antigen, POC Negative Negative   Influenza B Antigen, POC Negative Negative   Covid Antigen, POC Negative Negative        Latest Ref Rng & Units 04/25/2024    9:31 AM 11/23/2019    4:39 AM 11/22/2019    4:37 AM  CMP  Glucose 70 - 99 mg/dL 70  898  844  BUN 6 - 20 mg/dL 15  8  8    Creatinine 0.57 - 1.00 mg/dL 8.96  9.09  8.97   Sodium 134 - 144 mmol/L 141  138  139   Potassium 3.5 - 5.2 mmol/L 4.4  3.6  3.3   Chloride 96 - 106 mmol/L 105  106  110   CO2 20 - 29 mmol/L 22  24  21    Calcium 8.7 - 10.2 mg/dL 9.6  8.1  8.4   Total Protein 6.0 - 8.5 g/dL 7.5  6.6  5.7   Total Bilirubin 0.0 - 1.2 mg/dL 0.7  0.6  0.7   Alkaline Phos 41 - 116 IU/L 70  84  79   AST 0 - 40 IU/L 14  32  73   ALT 0 - 32 IU/L 15  76  108    Lipid Panel  No results found for: CHOL, TRIG, HDL, CHOLHDL, VLDL, LDLCALC, LDLDIRECT  CBC    Component Value Date/Time   WBC 7.3 04/25/2024 0931   WBC 7.4 11/23/2019 0439   RBC 4.50 04/25/2024 0931   RBC 4.17 11/23/2019 0439   HGB 13.5 04/25/2024 0931   HCT 41.5 04/25/2024 0931   PLT 279 04/25/2024 0931   MCV 92 04/25/2024 0931   MCH 30.0 04/25/2024 0931   MCH 29.7 11/23/2019 0439   MCHC 32.5 04/25/2024 0931   MCHC 34.3 11/23/2019 0439   RDW 12.2 04/25/2024 0931   LYMPHSABS 2.0 04/25/2024 0931   EOSABS 0.2 04/25/2024 0931   BASOSABS 0.1 04/25/2024 0931    ASSESSMENT AND PLAN: Assessment & Plan Acute upper respiratory infection Negative COVID, and flu tests. Still likely viral etiology such as common cold or RSV. - Acetaminophen  PRN for fever or body aches. - Robitussin DM for cough, especially if it disturbs sleep. - Throat lozenges or warm salt water gargles TID-QID. -Wear mask when in public or around others F/u if any  worsening - Provided work note for absence through Saturday.  Patient was given the opportunity to ask questions.  Patient verbalized understanding of the plan and was able to repeat key elements of the plan.   This documentation was completed using Paediatric nurse.  Any transcriptional errors are unintentional.  Orders Placed This Encounter  Procedures   POC Covid19/Flu A&B Antigen     Requested Prescriptions    No prescriptions requested or ordered in this encounter    No follow-ups on file.  Barnie Louder, MD, FACP     [1]  Current Outpatient Medications on File Prior to Visit  Medication Sig Dispense Refill   fluticasone  (FLONASE ) 50 MCG/ACT nasal spray Place 2 sprays into both nostrils daily. 16 g 6   No current facility-administered medications on file prior to visit.  [2] No Known Allergies  "

## 2024-07-25 ENCOUNTER — Ambulatory Visit (INDEPENDENT_AMBULATORY_CARE_PROVIDER_SITE_OTHER): Payer: Self-pay | Admitting: Primary Care
